# Patient Record
Sex: Male | Born: 1965 | Race: White | Hispanic: No | Marital: Married | Smoking: Never smoker
Health system: Southern US, Community
[De-identification: ages and names within clinical notes are randomized; demographics above are authoritative.]

## PROBLEM LIST (undated history)

## (undated) DIAGNOSIS — R519 Headache, unspecified: Secondary | ICD-10-CM

## (undated) DIAGNOSIS — R7303 Prediabetes: Secondary | ICD-10-CM

## (undated) DIAGNOSIS — R011 Cardiac murmur, unspecified: Secondary | ICD-10-CM

## (undated) DIAGNOSIS — I10 Essential (primary) hypertension: Secondary | ICD-10-CM

## (undated) DIAGNOSIS — C801 Malignant (primary) neoplasm, unspecified: Secondary | ICD-10-CM

## (undated) DIAGNOSIS — K219 Gastro-esophageal reflux disease without esophagitis: Secondary | ICD-10-CM

## (undated) DIAGNOSIS — J189 Pneumonia, unspecified organism: Secondary | ICD-10-CM

## (undated) DIAGNOSIS — M549 Dorsalgia, unspecified: Secondary | ICD-10-CM

## (undated) DIAGNOSIS — M1990 Unspecified osteoarthritis, unspecified site: Secondary | ICD-10-CM

## (undated) DIAGNOSIS — G8929 Other chronic pain: Secondary | ICD-10-CM

## (undated) HISTORY — PX: TONSILLECTOMY: SUR1361

## (undated) HISTORY — PX: COLECTOMY: SHX59

## (undated) HISTORY — PX: OTHER SURGICAL HISTORY: SHX169

---

## 2010-06-21 ENCOUNTER — Emergency Department (HOSPITAL_BASED_OUTPATIENT_CLINIC_OR_DEPARTMENT_OTHER)
Admission: EM | Admit: 2010-06-21 | Discharge: 2010-06-21 | Payer: Self-pay | Source: Home / Self Care | Admitting: Emergency Medicine

## 2012-07-05 ENCOUNTER — Emergency Department (HOSPITAL_BASED_OUTPATIENT_CLINIC_OR_DEPARTMENT_OTHER)
Admission: EM | Admit: 2012-07-05 | Discharge: 2012-07-05 | Disposition: A | Discharge status: Home / Self Care | Payer: Self-pay | Attending: Emergency Medicine | Admitting: Emergency Medicine

## 2012-07-05 ENCOUNTER — Encounter (HOSPITAL_BASED_OUTPATIENT_CLINIC_OR_DEPARTMENT_OTHER): Payer: Self-pay | Admitting: *Deleted

## 2012-07-05 DIAGNOSIS — Y929 Unspecified place or not applicable: Secondary | ICD-10-CM | POA: Insufficient documentation

## 2012-07-05 DIAGNOSIS — Y9389 Activity, other specified: Secondary | ICD-10-CM | POA: Insufficient documentation

## 2012-07-05 DIAGNOSIS — G8929 Other chronic pain: Secondary | ICD-10-CM | POA: Insufficient documentation

## 2012-07-05 DIAGNOSIS — X500XXA Overexertion from strenuous movement or load, initial encounter: Secondary | ICD-10-CM | POA: Insufficient documentation

## 2012-07-05 DIAGNOSIS — R209 Unspecified disturbances of skin sensation: Secondary | ICD-10-CM | POA: Insufficient documentation

## 2012-07-05 DIAGNOSIS — R011 Cardiac murmur, unspecified: Secondary | ICD-10-CM | POA: Insufficient documentation

## 2012-07-05 DIAGNOSIS — M5430 Sciatica, unspecified side: Secondary | ICD-10-CM | POA: Insufficient documentation

## 2012-07-05 HISTORY — DX: Other chronic pain: G89.29

## 2012-07-05 HISTORY — DX: Cardiac murmur, unspecified: R01.1

## 2012-07-05 HISTORY — DX: Dorsalgia, unspecified: M54.9

## 2012-07-05 MED ORDER — IBUPROFEN 600 MG PO TABS: 600.0000 mg | ORAL_TABLET | Freq: Four times a day (QID) | ORAL | Status: DC | PRN

## 2012-07-05 MED ORDER — HYDROCODONE-ACETAMINOPHEN 5-325 MG PO TABS
2.0000 | ORAL_TABLET | Freq: Once | ORAL | Status: AC
  Administered 2012-07-05: 2 via ORAL
  Filled 2012-07-05: qty 2

## 2012-07-05 MED ORDER — DIAZEPAM 2 MG PO TABS
2.0000 mg | ORAL_TABLET | Freq: Once | ORAL | Status: AC
  Administered 2012-07-05: 2 mg via ORAL
  Filled 2012-07-05: qty 1

## 2012-07-05 MED ORDER — IBUPROFEN 200 MG PO TABS
600.0000 mg | ORAL_TABLET | Freq: Once | ORAL | Status: AC
  Administered 2012-07-05: 600 mg via ORAL
  Filled 2012-07-05: qty 1

## 2012-07-05 MED ORDER — METHOCARBAMOL 500 MG PO TABS: 500.0000 mg | ORAL_TABLET | Freq: Four times a day (QID) | ORAL | Status: DC

## 2012-07-05 MED ORDER — HYDROCODONE-ACETAMINOPHEN 5-325 MG PO TABS: 1.0000 | ORAL_TABLET | ORAL | Status: DC | PRN

## 2012-07-05 NOTE — ED Notes (Signed)
Lower back pain since picking up a TV 3 days ago. Pain radiates into his left leg and left groin. Hx of chronic back pain.

## 2012-07-05 NOTE — ED Provider Notes (Signed)
History     CSN: [id number]  Arrival date & time 07/05/12  1312   First MD Initiated Contact with Patient 07/05/12 1331      Chief Complaint  Patient presents with  . Back Pain    (Consider location/radiation/quality/duration/timing/severity/associated sxs/prior treatment) HPI Comments: Pt with no medical hx comes in with cc of back pain, acute. Pt was lifting a 100+ lb TV 3 days agom, and developed sudden pain. The pain is located on the left side of the back, and it radiates down to the left toe. He also has associated numbness, tingling. No associated numbness, weakness, urinary incontinence, urinary retention, bowel incontinence, weakness.   Patient is a 46 y.o. male presenting with back pain. The history is provided by the patient.  Back Pain  Associated symptoms include numbness. Pertinent negatives include no chest pain, no fever, no headaches, no dysuria and no weakness.    Past Medical History  Diagnosis Date  . Chronic back pain   . Heart murmur     Past Surgical History  Procedure Date  . Arm surgery     No family history on file.  History  Substance Use Topics  . Smoking status: Never Smoker   . Smokeless tobacco: Not on file  . Alcohol Use: No      Review of Systems  Constitutional: Negative for fever, chills and activity change.  HENT: Negative for neck pain.   Eyes: Negative for visual disturbance.  Respiratory: Negative for cough, chest tightness and shortness of breath.   Cardiovascular: Negative for chest pain.  Gastrointestinal: Negative for abdominal distention.  Genitourinary: Negative for dysuria, enuresis and difficulty urinating.  Musculoskeletal: Positive for back pain. Negative for arthralgias.  Neurological: Positive for numbness. Negative for dizziness, weakness, light-headedness and headaches.  Psychiatric/Behavioral: Negative for confusion.    Allergies  Review of patient's allergies indicates no known allergies.  Home  Medications   Current Outpatient Rx  Name  Route  Sig  Dispense  Refill  . HYDROCODONE-ACETAMINOPHEN 5-325 MG PO TABS   Oral   Take 1 tablet by mouth every 4 (four) hours as needed for pain.   15 tablet   0   . IBUPROFEN 600 MG PO TABS   Oral   Take 1 tablet (600 mg total) by mouth every 6 (six) hours as needed for pain.   30 tablet   0   . METHOCARBAMOL 500 MG PO TABS   Oral   Take 1 tablet (500 mg total) by mouth 4 (four) times daily.   30 tablet   0     BP 136/77  Pulse 79  Temp 98.2 F (36.8 C) (Oral)  Resp 20  SpO2 100%  Physical Exam  Constitutional: He is oriented to person, place, and time. He appears well-developed.  HENT:  Head: Normocephalic and atraumatic.  Eyes: Conjunctivae normal and EOM are normal. Pupils are equal, round, and reactive to light.  Neck: Normal range of motion. Neck supple.  Cardiovascular: Normal rate and regular rhythm.   Pulmonary/Chest: Effort normal and breath sounds normal.  Abdominal: Soft. Bowel sounds are normal. He exhibits no distension. There is no tenderness. There is no rebound and no guarding.  Musculoskeletal:       Pt has tenderness over the lumbar region and the left lateral lower back region. No step offs, no erythema. Pt has 2+ patellar reflex bilaterally. Able to discriminate between sharp and dull. Able to ambulate + striaght leg test on the left  Neurological: He is alert and oriented to person, place, and time.  Skin: Skin is warm.    ED Course  Procedures (including critical care time)  Labs Reviewed - No data to display No results found.   1. Sciatica       MDM  DDx includes: - DJD of the back - Spondylitises/ spondylosis - Sciatica - Spinal cord compression - Conus medullaris - Epidural hematoma - Epidural abscess - Lytic/pathologic fracture - Myelitis - Musculoskeletal pain  Pt comes in with acute back pain with a precipitating factor. No risk factors for pathologic fractures, lytic  lesions etc. We will clinically diagnose as Sciatica based on the exam and treat wit appropriate meds.  No meds taken at home.   Derwood Kaplan, MD 07/05/12 [phone]

## 2013-01-09 ENCOUNTER — Emergency Department (HOSPITAL_BASED_OUTPATIENT_CLINIC_OR_DEPARTMENT_OTHER)
Admission: EM | Admit: 2013-01-09 | Discharge: 2013-01-09 | Disposition: A | Discharge status: Home / Self Care | Payer: Self-pay | Attending: Emergency Medicine | Admitting: Emergency Medicine

## 2013-01-09 ENCOUNTER — Encounter (HOSPITAL_BASED_OUTPATIENT_CLINIC_OR_DEPARTMENT_OTHER): Payer: Self-pay | Admitting: Family Medicine

## 2013-01-09 ENCOUNTER — Emergency Department (HOSPITAL_BASED_OUTPATIENT_CLINIC_OR_DEPARTMENT_OTHER): Payer: Self-pay

## 2013-01-09 DIAGNOSIS — G8929 Other chronic pain: Secondary | ICD-10-CM | POA: Insufficient documentation

## 2013-01-09 DIAGNOSIS — R011 Cardiac murmur, unspecified: Secondary | ICD-10-CM | POA: Insufficient documentation

## 2013-01-09 DIAGNOSIS — IMO0001 Reserved for inherently not codable concepts without codable children: Secondary | ICD-10-CM | POA: Insufficient documentation

## 2013-01-09 DIAGNOSIS — R1110 Vomiting, unspecified: Secondary | ICD-10-CM | POA: Insufficient documentation

## 2013-01-09 DIAGNOSIS — Z87828 Personal history of other (healed) physical injury and trauma: Secondary | ICD-10-CM | POA: Insufficient documentation

## 2013-01-09 DIAGNOSIS — Z9889 Other specified postprocedural states: Secondary | ICD-10-CM | POA: Insufficient documentation

## 2013-01-09 DIAGNOSIS — M25419 Effusion, unspecified shoulder: Secondary | ICD-10-CM | POA: Insufficient documentation

## 2013-01-09 DIAGNOSIS — M792 Neuralgia and neuritis, unspecified: Secondary | ICD-10-CM

## 2013-01-09 DIAGNOSIS — M5412 Radiculopathy, cervical region: Secondary | ICD-10-CM | POA: Insufficient documentation

## 2013-01-09 DIAGNOSIS — M62838 Other muscle spasm: Secondary | ICD-10-CM | POA: Insufficient documentation

## 2013-01-09 MED ORDER — PREDNISONE 10 MG PO TABS: ORAL_TABLET | ORAL | Status: DC

## 2013-01-09 MED ORDER — HYDROCODONE-ACETAMINOPHEN 5-325 MG PO TABS: 1.0000 | ORAL_TABLET | ORAL | Status: DC | PRN

## 2013-01-09 MED ORDER — ONDANSETRON 4 MG PO TBDP
4.0000 mg | ORAL_TABLET | Freq: Once | ORAL | Status: AC
  Administered 2013-01-09: 4 mg via ORAL

## 2013-01-09 MED ORDER — OXYCODONE-ACETAMINOPHEN 5-325 MG PO TABS
2.0000 | ORAL_TABLET | Freq: Once | ORAL | Status: AC
  Administered 2013-01-09: 2 via ORAL
  Filled 2013-01-09 (×2): qty 2

## 2013-01-09 MED ORDER — ONDANSETRON HCL 8 MG PO TABS: 4.0000 mg | ORAL_TABLET | Freq: Once | ORAL | Status: DC

## 2013-01-09 MED ORDER — SODIUM CHLORIDE 0.9 % IV SOLN: Freq: Once | INTRAVENOUS | Status: DC

## 2013-01-09 MED ORDER — PROMETHAZINE HCL 25 MG/ML IJ SOLN
25.0000 mg | Freq: Once | INTRAMUSCULAR | Status: DC
  Filled 2013-01-09: qty 1

## 2013-01-09 MED ORDER — ONDANSETRON 4 MG PO TBDP
ORAL_TABLET | ORAL | Status: AC
  Administered 2013-01-09: 4 mg via ORAL
  Filled 2013-01-09: qty 1

## 2013-01-09 MED ORDER — PROMETHAZINE HCL 25 MG PO TABS: 25.0000 mg | ORAL_TABLET | Freq: Four times a day (QID) | ORAL | Status: DC | PRN

## 2013-01-09 NOTE — ED Notes (Signed)
Pt c/o right wrist and forearm "spams" x 10 days. Pt reports h/o arm surgery with rod and pins.

## 2013-01-09 NOTE — ED Provider Notes (Signed)
History    CSN: [id number] Arrival date & time 01/09/13  1725  First MD Initiated Contact with Patient 01/09/13 1742     Chief Complaint  Patient presents with  . Arm Problem   (Consider location/radiation/quality/duration/timing/severity/associated sxs/prior Treatment) Patient is a 47 y.o. male presenting with shoulder pain. The history is provided by the patient. No language interpreter was used.  Shoulder Pain This is a new problem. The current episode started 1 to 4 weeks ago. The problem occurs constantly. The problem has been gradually worsening. Associated symptoms include joint swelling, myalgias and vomiting. The symptoms are aggravated by bending. He has tried nothing for the symptoms. The treatment provided no relief.  Pt complains of cramps and muscle spasm in right arm and shoulder.   Pt reports he has plates in his wrist from a previous injury.   Pt reports pain in shoulder, worse with turning head Past Medical History  Diagnosis Date  . Chronic back pain   . Heart murmur    Past Surgical History  Procedure Laterality Date  . Arm surgery     No family history on file. History  Substance Use Topics  . Smoking status: Never Smoker   . Smokeless tobacco: Not on file  . Alcohol Use: No    Review of Systems  Gastrointestinal: Positive for vomiting.  Musculoskeletal: Positive for myalgias and joint swelling.  All other systems reviewed and are negative.    Allergies  Review of patient's allergies indicates no known allergies.  Home Medications   Current Outpatient Rx  Name  Route  Sig  Dispense  Refill  . HYDROcodone-acetaminophen (NORCO/VICODIN) 5-325 MG per tablet   Oral   Take 1 tablet by mouth every 4 (four) hours as needed for pain.   15 tablet   0   . ibuprofen (ADVIL,MOTRIN) 600 MG tablet   Oral   Take 1 tablet (600 mg total) by mouth every 6 (six) hours as needed for pain.   30 tablet   0   . methocarbamol (ROBAXIN) 500 MG tablet   Oral  Take 1 tablet (500 mg total) by mouth 4 (four) times daily.   30 tablet   0    BP 129/69  Pulse 85  Temp(Src) 98.3 F (36.8 C) (Oral)  Resp 20  Ht 5\' 10"  (1.778 m)  Wt 200 lb (90.719 kg)  BMI 28.7 kg/m2  SpO2 98% Physical Exam  Nursing note and vitals reviewed. Constitutional: He appears well-developed and well-nourished.  HENT:  Head: Normocephalic.  Eyes: Conjunctivae are normal. Pupils are equal, round, and reactive to light.  Cardiovascular: Normal rate.   Pulmonary/Chest: Effort normal.  Musculoskeletal: He exhibits tenderness.  Tender cspine,  Tender shoulder,    Neurological: He is alert.  Skin: Skin is warm.  Psychiatric: He has a normal mood and affect.    ED Course  Procedures (including critical care time) Labs Reviewed - No data to display Dg Cervical Spine Complete  01/09/2013   *RADIOLOGY REPORT*  Clinical Data: Severe neck pain  CERVICAL SPINE - COMPLETE 4+ VIEW  Comparison: None.  Findings: Mild cervical kyphosis.  Normal alignment and no fracture or mass.  Mild spondylosis C3-C7.  Foraminal narrowing is present at C5-6 and C6-7.  Right foraminal encroachment at C6-7 is noted.  IMPRESSION: Cervical spondylosis.  No acute bony abnormality.   Original Report Authenticated By: Janeece Riggers, M.D.   Dg Shoulder Right  01/09/2013   *RADIOLOGY REPORT*  Clinical Data: Right shoulder pain.  No recent injury.  RIGHT SHOULDER - 2+ VIEW  Comparison: none  Findings: Normal alignment and no fracture.  No significant degenerative change.  IMPRESSION: Negative   Original Report Authenticated By: Janeece Riggers, M.D.   1. Radicular pain in right arm     MDM  Pt given 2 percocet for pain.  Pt had initial relief but began vomitting.   Pt given IV zofran with some relief.  Pt advised that Cspine film shows narrowing.    Pt had 2nd episode of vomiting.  Pt declined IV and phenergan    Pt given rx for prednsione taper,  Hydrocodone and phenergan.    Pt advised to see Dr. Phineas Douglas for  recheck in 1 week.    Elson Areas, PA-C 01/09/13 2101  Lonia Skinner [place], PA-C 01/09/13 2104

## 2013-01-09 NOTE — ED Notes (Signed)
Pt originally refused iv fluids and phenegran, felling much better at this time

## 2013-01-10 NOTE — ED Provider Notes (Signed)
Medical screening examination/treatment/procedure(s) were performed by non-physician practitioner and as supervising physician I was immediately available for consultation/collaboration.   Loren Racer, MD 01/10/13 [phone]

## 2013-06-16 ENCOUNTER — Emergency Department (HOSPITAL_BASED_OUTPATIENT_CLINIC_OR_DEPARTMENT_OTHER)
Admission: EM | Admit: 2013-06-16 | Discharge: 2013-06-16 | Disposition: A | Discharge status: Home / Self Care | Payer: Self-pay | Attending: Emergency Medicine | Admitting: Emergency Medicine

## 2013-06-16 ENCOUNTER — Emergency Department (HOSPITAL_BASED_OUTPATIENT_CLINIC_OR_DEPARTMENT_OTHER): Payer: Self-pay

## 2013-06-16 ENCOUNTER — Encounter (HOSPITAL_BASED_OUTPATIENT_CLINIC_OR_DEPARTMENT_OTHER): Payer: Self-pay | Admitting: Emergency Medicine

## 2013-06-16 DIAGNOSIS — M25539 Pain in unspecified wrist: Secondary | ICD-10-CM | POA: Insufficient documentation

## 2013-06-16 DIAGNOSIS — M25532 Pain in left wrist: Secondary | ICD-10-CM

## 2013-06-16 DIAGNOSIS — G8929 Other chronic pain: Secondary | ICD-10-CM | POA: Insufficient documentation

## 2013-06-16 DIAGNOSIS — R011 Cardiac murmur, unspecified: Secondary | ICD-10-CM | POA: Insufficient documentation

## 2013-06-16 DIAGNOSIS — Z79899 Other long term (current) drug therapy: Secondary | ICD-10-CM | POA: Insufficient documentation

## 2013-06-16 MED ORDER — OXYCODONE-ACETAMINOPHEN 5-325 MG PO TABS: 1.0000 | ORAL_TABLET | Freq: Once | ORAL | Status: DC

## 2013-06-16 MED ORDER — OXYCODONE-ACETAMINOPHEN 5-325 MG PO TABS
1.0000 | ORAL_TABLET | Freq: Once | ORAL | Status: AC
  Administered 2013-06-16: 1 via ORAL
  Filled 2013-06-16: qty 1

## 2013-06-16 NOTE — ED Provider Notes (Signed)
CSN: [id number]     Arrival date & time 06/16/13  2031 History   First MD Initiated Contact with Patient 06/16/13 2200     Chief Complaint  Patient presents with  . Hand Pain   (Consider location/radiation/quality/duration/timing/severity/associated sxs/prior Treatment) Patient is a 47 y.o. male presenting with hand pain. The history is provided by the patient and the spouse. No language interpreter was used.  Hand Pain This is a new problem. Pertinent negatives include no fever. Associated symptoms comments: Pain at left wrist for the past 5 days without known injury. He has had mild swelling. No numbness or tingling. No discoloration. He reports feeling a 'knot' on the wrist that is the source of the pain..    Past Medical History  Diagnosis Date  . Chronic back pain   . Heart murmur    Past Surgical History  Procedure Laterality Date  . Arm surgery     History reviewed. No pertinent family history. History  Substance Use Topics  . Smoking status: Never Smoker   . Smokeless tobacco: Not on file  . Alcohol Use: No    Review of Systems  Constitutional: Negative for fever.  Musculoskeletal:       See HPI.  Skin: Negative for color change.    Allergies  Review of patient's allergies indicates no known allergies.  Home Medications   Current Outpatient Rx  Name  Route  Sig  Dispense  Refill  . HYDROcodone-acetaminophen (NORCO/VICODIN) 5-325 MG per tablet   Oral   Take 1 tablet by mouth every 4 (four) hours as needed for pain.   20 tablet   0   . ibuprofen (ADVIL,MOTRIN) 600 MG tablet   Oral   Take 1 tablet (600 mg total) by mouth every 6 (six) hours as needed for pain.   30 tablet   0   . methocarbamol (ROBAXIN) 500 MG tablet   Oral   Take 1 tablet (500 mg total) by mouth 4 (four) times daily.   30 tablet   0   . predniSONE (DELTASONE) 10 MG tablet      6,5,4,3,2,1 taper   21 tablet   0   . promethazine (PHENERGAN) 25 MG tablet   Oral   Take 1 tablet  (25 mg total) by mouth every 6 (six) hours as needed for nausea.   14 tablet   0    BP 134/77  Pulse 88  Temp(Src) 98.9 F (37.2 C) (Oral)  Resp 16  Ht 5\' 11"  (1.803 m)  Wt 207 lb (93.895 kg)  BMI 28.88 kg/m2  SpO2 96% Physical Exam  Constitutional: He is oriented to person, place, and time. He appears well-developed and well-nourished. No distress.  Musculoskeletal:  Left wrist/hand mildly swollen without redness or warmth. Focal tenderness to dorsum wrist at palpable nodule that is firm non-mobile. FROM of wrist and fingers.   Neurological: He is alert and oriented to person, place, and time.  Skin: Skin is warm and dry.    ED Course  Procedures (including critical care time) Labs Review Labs Reviewed - No data to display Imaging Review Dg Hand Complete Left  06/16/2013   CLINICAL DATA:  Pain for 5 days without trauma  EXAM: LEFT HAND - COMPLETE 3+ VIEW  COMPARISON:  None.  FINDINGS: No acute fracture or dislocation. Mild osteoarthritis, including at the distal interphalangeal joints of the 3rd and 4th digits.  IMPRESSION: No acute osseous abnormality.   Electronically Signed   By: Jeronimo Greaves  M.D.   On: 06/16/2013 21:37    EKG Interpretation   None       MDM  No diagnosis found. 1. Left wrist pain  Negative x-ray for bony injury. No vascular compromise seen on exam. Suspect musculoskeletal sprain/strain injury. REfer back to [facility] where he has been a patient.    Arnoldo Hooker, PA-C 06/16/13 2322

## 2013-06-16 NOTE — ED Notes (Signed)
Left hand pain onset 5 days ago denies event or injury

## 2013-06-17 ENCOUNTER — Telehealth (HOSPITAL_BASED_OUTPATIENT_CLINIC_OR_DEPARTMENT_OTHER): Payer: Self-pay

## 2013-06-17 NOTE — ED Provider Notes (Signed)
Medical screening examination/treatment/procedure(s) were performed by non-physician practitioner and as supervising physician I was immediately available for consultation/collaboration.  EKG Interpretation   None         William Jonquil Stubbe, MD 06/17/13 0007 

## 2013-09-02 ENCOUNTER — Emergency Department (HOSPITAL_BASED_OUTPATIENT_CLINIC_OR_DEPARTMENT_OTHER)
Admission: EM | Admit: 2013-09-02 | Discharge: 2013-09-02 | Disposition: A | Discharge status: Home / Self Care | Payer: Self-pay | Attending: Emergency Medicine | Admitting: Emergency Medicine

## 2013-09-02 ENCOUNTER — Emergency Department (HOSPITAL_BASED_OUTPATIENT_CLINIC_OR_DEPARTMENT_OTHER): Payer: Self-pay

## 2013-09-02 ENCOUNTER — Encounter (HOSPITAL_BASED_OUTPATIENT_CLINIC_OR_DEPARTMENT_OTHER): Payer: Self-pay | Admitting: Emergency Medicine

## 2013-09-02 DIAGNOSIS — J209 Acute bronchitis, unspecified: Secondary | ICD-10-CM | POA: Insufficient documentation

## 2013-09-02 DIAGNOSIS — R011 Cardiac murmur, unspecified: Secondary | ICD-10-CM | POA: Insufficient documentation

## 2013-09-02 DIAGNOSIS — J40 Bronchitis, not specified as acute or chronic: Secondary | ICD-10-CM

## 2013-09-02 DIAGNOSIS — Z79899 Other long term (current) drug therapy: Secondary | ICD-10-CM | POA: Insufficient documentation

## 2013-09-02 DIAGNOSIS — G8929 Other chronic pain: Secondary | ICD-10-CM | POA: Insufficient documentation

## 2013-09-02 DIAGNOSIS — IMO0002 Reserved for concepts with insufficient information to code with codable children: Secondary | ICD-10-CM | POA: Insufficient documentation

## 2013-09-02 MED ORDER — AZITHROMYCIN 250 MG PO TABS: 250.0000 mg | ORAL_TABLET | Freq: Every day | ORAL | Status: DC

## 2013-09-02 MED ORDER — HYDROCODONE-ACETAMINOPHEN 5-325 MG PO TABS: 2.0000 | ORAL_TABLET | ORAL | Status: DC | PRN

## 2013-09-02 NOTE — ED Provider Notes (Signed)
CSN: [id number]     Arrival date & time 09/02/13  1456 History  This chart was scribed for Don Johns, MD by Maree Erie, ED Scribe. The patient was seen in room MH01/MH01. Patient's care was started at 4:20 PM.     Chief Complaint  Patient presents with  . Cough     Patient is a 48 y.o. male presenting with cough. The history is provided by the patient. No language interpreter was used.  Cough Associated symptoms: chest pain (with cough)   Associated symptoms: no chills, no diaphoresis, no fever, no headaches, no rash, no rhinorrhea and no shortness of breath     HPI Comments: Don Rios is a 48 y.o. male who presents to the Emergency Department complaining of a constant productive cough with green sputum that began about a week ago. He has associated right sided chest pain with coughing. He describes the pain as "getting hit with a baseball bat." He has been taking Dayquil, Nyquil, Robitussin and Alka Seltzer without relief. He denies shortness of breath, fever, vomiting, rhinorrhea or congestion.  Pt denies having a PCP currently.  Past Medical History  Diagnosis Date  . Chronic back pain   . Heart murmur    Past Surgical History  Procedure Laterality Date  . Arm surgery     No family history on file. History  Substance Use Topics  . Smoking status: Never Smoker   . Smokeless tobacco: Not on file  . Alcohol Use: No    Review of Systems  Constitutional: Negative for fever, chills, diaphoresis and fatigue.  HENT: Negative for congestion, rhinorrhea and sneezing.   Eyes: Negative.   Respiratory: Positive for cough. Negative for chest tightness and shortness of breath.   Cardiovascular: Positive for chest pain (with cough). Negative for leg swelling.  Gastrointestinal: Negative for nausea, vomiting, abdominal pain, diarrhea and blood in stool.  Genitourinary: Negative for frequency, hematuria, flank pain and difficulty urinating.  Musculoskeletal: Negative for  arthralgias and back pain.  Skin: Negative for rash.  Neurological: Negative for dizziness, speech difficulty, weakness, numbness and headaches.      Allergies  Review of patient's allergies indicates no known allergies.  Home Medications   Current Outpatient Rx  Name  Route  Sig  Dispense  Refill  . azithromycin (ZITHROMAX) 250 MG tablet   Oral   Take 1 tablet (250 mg total) by mouth daily. Take first 2 tablets together, then 1 every day until finished.   6 tablet   0   . HYDROcodone-acetaminophen (NORCO/VICODIN) 5-325 MG per tablet   Oral   Take 1 tablet by mouth every 4 (four) hours as needed for pain.   20 tablet   0   . HYDROcodone-acetaminophen (NORCO/VICODIN) 5-325 MG per tablet   Oral   Take 2 tablets by mouth every 4 (four) hours as needed.   15 tablet   0   . ibuprofen (ADVIL,MOTRIN) 600 MG tablet   Oral   Take 1 tablet (600 mg total) by mouth every 6 (six) hours as needed for pain.   30 tablet   0   . methocarbamol (ROBAXIN) 500 MG tablet   Oral   Take 1 tablet (500 mg total) by mouth 4 (four) times daily.   30 tablet   0   . oxyCODONE-acetaminophen (PERCOCET/ROXICET) 5-325 MG per tablet   Oral   Take 1-2 tablets by mouth once.   20 tablet   0   . predniSONE (DELTASONE) 10 MG tablet  6,5,4,3,2,1 taper   21 tablet   0   . promethazine (PHENERGAN) 25 MG tablet   Oral   Take 1 tablet (25 mg total) by mouth every 6 (six) hours as needed for nausea.   14 tablet   0    Triage Vitals: BP 117/79  Pulse 74  Temp(Src) 98.2 F (36.8 C) (Oral)  Resp 20  Ht 5\' 11"  (1.803 m)  Wt 207 lb (93.895 kg)  BMI 28.88 kg/m2  SpO2 100%  Physical Exam  Nursing note and vitals reviewed. Constitutional: He is oriented to person, place, and time. He appears well-developed and well-nourished.  HENT:  Head: Normocephalic and atraumatic.  Eyes: Pupils are equal, round, and reactive to light.  Neck: Normal range of motion. Neck supple.  Cardiovascular:  Normal rate, regular rhythm and normal heart sounds.   Pulmonary/Chest: Effort normal and breath sounds normal. No respiratory distress. He has no wheezes. He has no rales. He exhibits tenderness.  Tenderness to palpation of right mid ribs at the mid axillary line.   Abdominal: Soft. Bowel sounds are normal. There is no tenderness. There is no rebound and no guarding.  Musculoskeletal: Normal range of motion. He exhibits no edema.  Lymphadenopathy:    He has no cervical adenopathy.  Neurological: He is alert and oriented to person, place, and time.  Skin: Skin is warm and dry. No rash noted.  Psychiatric: He has a normal mood and affect.    ED Course  Procedures (including critical care time)  DIAGNOSTIC STUDIES: Oxygen Saturation is 100% on room air, normal by my interpretation.    COORDINATION OF CARE: 4:23 PM -Will repeat chest x-ray with nipple markers because patient does not have established PCP. Patient verbalizes understanding and agrees with treatment plan.    Labs Review Labs Reviewed - No data to display Imaging Review Dg Chest 2 View  09/02/2013   CLINICAL DATA:  Possible lung nodule  EXAM: CHEST  2 VIEW  COMPARISON:  09/02/2013, 11/17/2010  FINDINGS: Cardiac shadow is stable. The lungs are clear bilaterally. The previously seen density suggesting a nipple shadow is shown did not represent a nipple shadow with nipple markers placed. Comparison however with the prior CT examination shows that this represents a calcified granuloma at.  IMPRESSION: Findings of prior granulomatous disease. No other focal abnormality is seen.   Electronically Signed   By: Inez Catalina M.D.   On: 09/02/2013 16:50   Dg Chest 2 View  09/02/2013   CLINICAL DATA:  Cough  EXAM: CHEST  2 VIEW  COMPARISON:  Nov 17, 2010  FINDINGS: There is a nodular opacity in or overlying the left base which potentially may represent a nipple shadow. Elsewhere, lungs are clear. Heart size and pulmonary vascularity are  normal. No adenopathy. No bone lesions.  IMPRESSION: Question nipple shadow versus small nodule left base. Advise repeat study with nipple markers to further evaluate. Lungs elsewhere clear.   Electronically Signed   By: Lowella Grip M.D.   On: 09/02/2013 15:52    EKG Interpretation   None       MDM   Final diagnoses:  Bronchitis    Patient presents with a one-week history of worsening cough and chest congestion. He has no shortness of breath. He has some tenderness in his right ribs which he says occurred with a bad coughing spell. It's worse with movement and worse with palpation. He did not have any other symptoms suggestive of pulmonary embolus. There is no  evidence of pneumonia or pneumothorax. He was discharged with a prescription for Zithromax and Vicodin for pain and coughing. He was encouraged to followup with his primary care physician if his symptoms are not improving.  I personally performed the services described in this documentation, which was scribed in my presence.  The recorded information has been reviewed and considered.     Don Johns, MD 09/02/13 [phone]

## 2013-09-02 NOTE — Discharge Instructions (Signed)

## 2013-09-02 NOTE — ED Notes (Signed)
Productive cough with green sputum x 1 week.

## 2013-09-19 ENCOUNTER — Emergency Department (HOSPITAL_BASED_OUTPATIENT_CLINIC_OR_DEPARTMENT_OTHER)
Admission: EM | Admit: 2013-09-19 | Discharge: 2013-09-19 | Disposition: A | Discharge status: Home / Self Care | Payer: Self-pay | Attending: Emergency Medicine | Admitting: Emergency Medicine

## 2013-09-19 ENCOUNTER — Encounter (HOSPITAL_BASED_OUTPATIENT_CLINIC_OR_DEPARTMENT_OTHER): Payer: Self-pay | Admitting: Emergency Medicine

## 2013-09-19 DIAGNOSIS — G8929 Other chronic pain: Secondary | ICD-10-CM | POA: Insufficient documentation

## 2013-09-19 DIAGNOSIS — IMO0002 Reserved for concepts with insufficient information to code with codable children: Secondary | ICD-10-CM | POA: Insufficient documentation

## 2013-09-19 DIAGNOSIS — X500XXA Overexertion from strenuous movement or load, initial encounter: Secondary | ICD-10-CM | POA: Insufficient documentation

## 2013-09-19 DIAGNOSIS — Y929 Unspecified place or not applicable: Secondary | ICD-10-CM | POA: Insufficient documentation

## 2013-09-19 DIAGNOSIS — Z79899 Other long term (current) drug therapy: Secondary | ICD-10-CM | POA: Insufficient documentation

## 2013-09-19 DIAGNOSIS — R011 Cardiac murmur, unspecified: Secondary | ICD-10-CM | POA: Insufficient documentation

## 2013-09-19 DIAGNOSIS — M5380 Other specified dorsopathies, site unspecified: Secondary | ICD-10-CM | POA: Insufficient documentation

## 2013-09-19 DIAGNOSIS — M62838 Other muscle spasm: Secondary | ICD-10-CM

## 2013-09-19 DIAGNOSIS — Y9389 Activity, other specified: Secondary | ICD-10-CM | POA: Insufficient documentation

## 2013-09-19 MED ORDER — METHOCARBAMOL 500 MG PO TABS: 500.0000 mg | ORAL_TABLET | Freq: Two times a day (BID) | ORAL | Status: DC

## 2013-09-19 MED ORDER — PREDNISONE 20 MG PO TABS: ORAL_TABLET | ORAL | Status: DC

## 2013-09-19 MED ORDER — PREDNISONE 50 MG PO TABS
60.0000 mg | ORAL_TABLET | Freq: Once | ORAL | Status: AC
  Administered 2013-09-19: 60 mg via ORAL
  Filled 2013-09-19 (×2): qty 1

## 2013-09-19 MED ORDER — HYDROCODONE-ACETAMINOPHEN 5-325 MG PO TABS
2.0000 | ORAL_TABLET | Freq: Once | ORAL | Status: AC
  Administered 2013-09-19: 2 via ORAL
  Filled 2013-09-19: qty 2

## 2013-09-19 MED ORDER — LIDOCAINE 5 % EX PTCH: 1.0000 | MEDICATED_PATCH | CUTANEOUS | Status: DC

## 2013-09-19 MED ORDER — METHOCARBAMOL 500 MG PO TABS
1000.0000 mg | ORAL_TABLET | Freq: Once | ORAL | Status: AC
  Administered 2013-09-19: 1000 mg via ORAL
  Filled 2013-09-19: qty 2

## 2013-09-19 MED ORDER — HYDROCODONE-ACETAMINOPHEN 5-325 MG PO TABS: 1.0000 | ORAL_TABLET | Freq: Four times a day (QID) | ORAL | Status: DC | PRN

## 2013-09-19 NOTE — Discharge Instructions (Signed)
Muscle Cramps and Spasms Muscle cramps and spasms occur when a muscle or muscles tighten and you have no control over this tightening (involuntary muscle contraction). They are a common problem and can develop in any muscle. The most common place is in the calf muscles of the leg. Both muscle cramps and muscle spasms are involuntary muscle contractions, but they also have differences:   Muscle cramps are sporadic and painful. They may last a few seconds to a quarter of an hour. Muscle cramps are often more forceful and last longer than muscle spasms.  Muscle spasms may or may not be painful. They may also last just a few seconds or much longer. CAUSES  It is uncommon for cramps or spasms to be due to a serious underlying problem. In many cases, the cause of cramps or spasms is unknown. Some common causes are:   Overexertion.   Overuse from repetitive motions (doing the same thing over and over).   Remaining in a certain position for a long period of time.   Improper preparation, form, or technique while performing a sport or activity.   Dehydration.   Injury.   Side effects of some medicines.   Abnormally low levels of the salts and ions in your blood (electrolytes), especially potassium and calcium. This could happen if you are taking water pills (diuretics) or you are pregnant.  Some underlying medical problems can make it more likely to develop cramps or spasms. These include, but are not limited to:   Diabetes.   Parkinson disease.   Hormone disorders, such as thyroid problems.   Alcohol abuse.   Diseases specific to muscles, joints, and bones.   Blood vessel disease where not enough blood is getting to the muscles.  HOME CARE INSTRUCTIONS   Stay well hydrated. Drink enough water and fluids to keep your urine clear or pale yellow.  It may be helpful to massage, stretch, and relax the affected muscle.  For tight or tense muscles, use a warm towel, heating  pad, or hot shower water directed to the affected area.  If you are sore or have pain after a cramp or spasm, applying ice to the affected area may relieve discomfort.  Put ice in a plastic bag.  Place a towel between your skin and the bag.  Leave the ice on for 15-20 minutes, 03-04 times a day.  Medicines used to treat a known cause of cramps or spasms may help reduce their frequency or severity. Only take over-the-counter or prescription medicines as directed by your caregiver. SEEK MEDICAL CARE IF:  Your cramps or spasms get more severe, more frequent, or do not improve over time.  MAKE SURE YOU:   Understand these instructions.  Will watch your condition.  Will get help right away if you are not doing well or get worse. Document Released: 12/24/2001 Document Revised: 10/29/2012 Document Reviewed: 06/20/2012 [facility] Patient Information 2014 [place], [place].  Heat Therapy Heat therapy can help ease achy, tense, stiff, and tight muscles and joints. Heat should not be used on new injuries. Wait at least 48 hours after the injury before using heat therapy. Heat also should not be used for discomfort or pain that occurs right after doing an activity. If you still have pain or stiffness 3 hours after finishing the activity, then heat therapy may be used. PRECAUTIONS  High heat or prolonged exposure to heat can cause burns. Be careful when using heat therapy to avoid burning your skin. If you have any  of the following conditions, do not use heat until you have discussed heat therapy with your caregiver:  Poor circulation.  Healing wounds or scarred skin in the area being treated.  Diabetes, heart disease, or high blood pressure.  Numbness of the area being treated.  Unusual swelling of the area being treated.  Active infections.  Blood clots.  Cancer.  Inability to communicate your response to pain. This can include young children and people with dementia. HOME CARE  INSTRUCTIONS Moist heat pack  Soak a clean towel in warm water, and squeeze out the extra water. The water temperature should be comfortable to the skin.  Put the warm, wet towel in a plastic bag.  Place a thin, dry towel between your skin and the bag.  Put the heat pack on the area for 5 minutes, and check your skin. Your skin may be pink, but it should not be red.  Leave the heat pack on the area for a total of 15 to 30 minutes.  Repeat this every 2 to 4 hours while awake. Do not use heat while you are sleeping. Warm water bath  Fill a tub with warm water. The water temperature should be comfortable to the skin.  Place the affected body part in the tub.  Soak the area for 20 to 40 minutes.  Repeat as needed. Hot water bottle  Fill the water bottle half full with hot water.  Press out the extra air. Close the cap tightly.  Place a dry towel between your skin and the bottle.  Put the bottle on the area for 5 minutes, and check your skin. Your skin may be pink, but it should not be red.  Leave the bottle on the area for a total of 15 to 30 minutes.  Repeat this every 2 to 4 hours while awake. Electric heating pad  Place a dry towel between your skin and the heating pad.  Set the heating pad on low heat.  Put the heating pad on the area for 10 minutes, and check your skin. Your skin may be pink, but it should not be red.  Leave the heating pad on the area for a total of 20 to 40 minutes.  Repeat this every 2 to 4 hours while awake.  Do not lie on the heating pad.  Do not fall asleep while using the heating pad.  Do not use the heating pad near water. Contact with water can result in an electrical shock. SEEK MEDICAL CARE IF:  You have blisters, redness, swelling, or numbness.  You have any new problems.  Your problems are getting worse.  You have any questions or concerns. If you develop any problems, stop using heat therapy until you see your  caregiver. MAKE SURE YOU:  Understand these instructions.  Will watch your condition.  Will get help right away if you are not doing well or get worse. Document Released: 09/26/2011 Document Reviewed: 09/26/2011 [facility] Patient Information 2014 [place].

## 2013-09-19 NOTE — ED Provider Notes (Signed)
CSN: [id number]     Arrival date & time 09/19/13  7616 History   None    Chief Complaint  Patient presents with  . Back Injury     (Consider location/radiation/quality/duration/timing/severity/associated sxs/prior Treatment) Patient is a 48 y.o. male presenting with back pain. The history is provided by the patient. No language interpreter was used.  Back Pain Location:  Sacro-iliac joint Quality:  Aching Radiates to:  Does not radiate Pain severity:  Severe Pain is:  Same all the time Onset quality:  Sudden Timing:  Constant Progression:  Unchanged Chronicity:  Recurrent Context: lifting heavy objects   Relieved by:  Nothing Worsened by:  Nothing tried Ineffective treatments:  NSAIDs Associated symptoms: no abdominal pain, no abdominal swelling, no bladder incontinence, no bowel incontinence, no chest pain, no fever, no headaches, no leg pain, no numbness, no paresthesias, no pelvic pain, no perianal numbness, no tingling, no weakness and no weight loss   Risk factors: no hx of cancer     Past Medical History  Diagnosis Date  . Chronic back pain   . Heart murmur    Past Surgical History  Procedure Laterality Date  . Arm surgery     History reviewed. No pertinent family history. History  Substance Use Topics  . Smoking status: Never Smoker   . Smokeless tobacco: Not on file  . Alcohol Use: No    Review of Systems  Constitutional: Negative for fever and weight loss.  Cardiovascular: Negative for chest pain.  Gastrointestinal: Negative for abdominal pain and bowel incontinence.  Genitourinary: Negative for bladder incontinence and pelvic pain.  Musculoskeletal: Positive for back pain.  Neurological: Negative for tingling, weakness, numbness, headaches and paresthesias.  All other systems reviewed and are negative.      Allergies  Review of patient's allergies indicates no known allergies.  Home Medications   Current Outpatient Rx  Name  Route  Sig   Dispense  Refill  . azithromycin (ZITHROMAX) 250 MG tablet   Oral   Take 1 tablet (250 mg total) by mouth daily. Take first 2 tablets together, then 1 every day until finished.   6 tablet   0   . HYDROcodone-acetaminophen (NORCO) 5-325 MG per tablet   Oral   Take 1 tablet by mouth every 6 (six) hours as needed.   10 tablet   0   . HYDROcodone-acetaminophen (NORCO/VICODIN) 5-325 MG per tablet   Oral   Take 1 tablet by mouth every 4 (four) hours as needed for pain.   20 tablet   0   . HYDROcodone-acetaminophen (NORCO/VICODIN) 5-325 MG per tablet   Oral   Take 2 tablets by mouth every 4 (four) hours as needed.   15 tablet   0   . ibuprofen (ADVIL,MOTRIN) 600 MG tablet   Oral   Take 1 tablet (600 mg total) by mouth every 6 (six) hours as needed for pain.   30 tablet   0   . lidocaine (LIDODERM) 5 %   Transdermal   Place 1 patch onto the skin daily. Remove & Discard patch within 12 hours or as directed by MD   30 patch   0   . methocarbamol (ROBAXIN) 500 MG tablet   Oral   Take 1 tablet (500 mg total) by mouth 4 (four) times daily.   30 tablet   0   . methocarbamol (ROBAXIN) 500 MG tablet   Oral   Take 1 tablet (500 mg total) by mouth 2 (two) times  daily.   20 tablet   0   . oxyCODONE-acetaminophen (PERCOCET/ROXICET) 5-325 MG per tablet   Oral   Take 1-2 tablets by mouth once.   20 tablet   0   . predniSONE (DELTASONE) 10 MG tablet      6,5,4,3,2,1 taper   21 tablet   0   . predniSONE (DELTASONE) 20 MG tablet      3 tabs po day one, then 2 po daily x 4 days   11 tablet   0   . promethazine (PHENERGAN) 25 MG tablet   Oral   Take 1 tablet (25 mg total) by mouth every 6 (six) hours as needed for nausea.   14 tablet   0    BP 123/56  Pulse 88  Temp(Src) 97.8 F (36.6 C) (Oral)  Resp 20  Ht 5\' 11"  (1.803 m)  Wt 205 lb (92.987 kg)  BMI 28.60 kg/m2  SpO2 96% Physical Exam  Constitutional: He is oriented to person, place, and time. He appears  well-developed and well-nourished. No distress.  HENT:  Head: Normocephalic and atraumatic.  Mouth/Throat: Oropharynx is clear and moist.  Eyes: Conjunctivae are normal. Pupils are equal, round, and reactive to light.  Neck: Normal range of motion. Neck supple.  Cardiovascular: Normal rate, regular rhythm and intact distal pulses.   Pulmonary/Chest: Effort normal and breath sounds normal. He has no wheezes. He has no rales.  Abdominal: Soft. Bowel sounds are increased. There is no tenderness. There is no rebound and no guarding.  Musculoskeletal: Normal range of motion. He exhibits no edema and no tenderness.       Lumbar back: He exhibits normal range of motion, no bony tenderness, no swelling and no edema.       Back:  Neurological: He is alert and oriented to person, place, and time. He has normal reflexes.  Skin: Skin is warm and dry.  Psychiatric: He has a normal mood and affect.    ED Course  Procedures (including critical care time) Labs Review Labs Reviewed - No data to display Imaging Review No results found.   EKG Interpretation None      MDM   Final diagnoses:  None   Muscle spasm.  Doubt bony pathology based on exam.  Will treat for muscle spasm.  Follow up at your family doctor for ongoing care     Ricquel Foulk K Laquana Villari-Rasch, MD 09/19/13 [phone]

## 2013-09-19 NOTE — ED Notes (Signed)
Low back pain after loading wood yesterday, no relief from Aleve

## 2014-07-23 ENCOUNTER — Encounter (HOSPITAL_BASED_OUTPATIENT_CLINIC_OR_DEPARTMENT_OTHER): Payer: Self-pay

## 2014-07-23 ENCOUNTER — Emergency Department (HOSPITAL_BASED_OUTPATIENT_CLINIC_OR_DEPARTMENT_OTHER)
Admission: EM | Admit: 2014-07-23 | Discharge: 2014-07-23 | Disposition: A | Discharge status: Home / Self Care | Payer: Self-pay | Attending: Emergency Medicine | Admitting: Emergency Medicine

## 2014-07-23 DIAGNOSIS — R011 Cardiac murmur, unspecified: Secondary | ICD-10-CM | POA: Insufficient documentation

## 2014-07-23 DIAGNOSIS — G8929 Other chronic pain: Secondary | ICD-10-CM | POA: Insufficient documentation

## 2014-07-23 DIAGNOSIS — L0211 Cutaneous abscess of neck: Secondary | ICD-10-CM | POA: Insufficient documentation

## 2014-07-23 MED ORDER — CEPHALEXIN 500 MG PO CAPS: 500.0000 mg | ORAL_CAPSULE | Freq: Four times a day (QID) | ORAL | Status: DC

## 2014-07-23 MED ORDER — SULFAMETHOXAZOLE-TRIMETHOPRIM 800-160 MG PO TABS: 1.0000 | ORAL_TABLET | Freq: Two times a day (BID) | ORAL | Status: AC

## 2014-07-23 MED ORDER — HYDROCODONE-ACETAMINOPHEN 5-325 MG PO TABS: 1.0000 | ORAL_TABLET | Freq: Four times a day (QID) | ORAL | Status: DC | PRN

## 2014-07-23 NOTE — ED Notes (Signed)
C/o abscess to left posterior neck x 1 week-?spider bite

## 2014-07-23 NOTE — ED Provider Notes (Addendum)
CSN: [id number]     Arrival date & time 07/23/14  1135 History   First MD Initiated Contact with Patient 07/23/14 1142     Chief Complaint  Patient presents with  . Abscess     (Consider location/radiation/quality/duration/timing/severity/associated sxs/prior Treatment) HPI Comments: Patient is a 49 year old male presents with complaints of a swollen, tender area to the back of his left neck. He is concerned he may have been bitten by a spider. He denies any fevers or chills. The wife has been treating it with various topical applications at home, however continues to worsen.  Patient is a 49 y.o. male presenting with abscess. The history is provided by the patient.  Abscess Location:  Head/neck Abscess quality: induration and warmth   Abscess quality: not draining and no fluctuance   Duration:  1 week Progression:  Worsening Chronicity:  New Relieved by:  Nothing Worsened by:  Nothing tried Ineffective treatments:  None tried   Past Medical History  Diagnosis Date  . Chronic back pain   . Heart murmur    Past Surgical History  Procedure Laterality Date  . Arm surgery     No family history on file. History  Substance Use Topics  . Smoking status: Never Smoker   . Smokeless tobacco: Not on file  . Alcohol Use: No    Review of Systems  All other systems reviewed and are negative.     Allergies  Review of patient's allergies indicates no known allergies.  Home Medications   Prior to Admission medications   Not on File   BP 125/71 mmHg  Pulse 82  Temp(Src) 98.4 F (36.9 C) (Oral)  Resp 18  Ht 5\' 11"  (1.803 m)  Wt 205 lb (92.987 kg)  BMI 28.60 kg/m2  SpO2 98% Physical Exam  Constitutional: He is oriented to person, place, and time. He appears well-developed and well-nourished. No distress.  HENT:  Head: Normocephalic and atraumatic.  Neck: Normal range of motion. Neck supple.  There is a 2.5 cm round, indurated area to the left of the posterior neck.  The skin overlying has been excoriated and the abscess has been unroofed.  Musculoskeletal: Normal range of motion.  Neurological: He is alert and oriented to person, place, and time.  Skin: Skin is warm and dry. He is not diaphoretic.  Nursing note and vitals reviewed.   ED Course  Procedures (including critical care time) Labs Review Labs Reviewed - No data to display  Imaging Review No results found.   EKG Interpretation None      MDM   Final diagnoses:  None    The wound appears to be unroofed already. I am unable to appreciate any fluctuance. Will treat with Keflex, Bactrim, pain medication. I've also advised warm soaks as frequent as possible for the next several days. The return as needed if symptoms worsen.    Veryl Speak, MD 07/23/14 1151  Veryl Speak, MD 07/23/14 1155

## 2014-07-23 NOTE — Discharge Instructions (Signed)
Keflex and Bactrim as prescribed.  Hydrocodone as prescribed as needed for pain.  Apply warm soaks as frequently as possible for the next 2-3 days, and return to the ER if your symptoms substantially worsen or change.   Abscess An abscess is an infected area that contains a collection of pus and debris.It can occur in almost any part of the body. An abscess is also known as a furuncle or boil. CAUSES  An abscess occurs when tissue gets infected. This can occur from blockage of oil or sweat glands, infection of hair follicles, or a minor injury to the skin. As the body tries to fight the infection, pus collects in the area and creates pressure under the skin. This pressure causes pain. People with weakened immune systems have difficulty fighting infections and get certain abscesses more often.  SYMPTOMS Usually an abscess develops on the skin and becomes a painful mass that is red, warm, and tender. If the abscess forms under the skin, you may feel a moveable soft area under the skin. Some abscesses break open (rupture) on their own, but most will continue to get worse without care. The infection can spread deeper into the body and eventually into the bloodstream, causing you to feel ill.  DIAGNOSIS  Your caregiver will take your medical history and perform a physical exam. A sample of fluid may also be taken from the abscess to determine what is causing your infection. TREATMENT  Your caregiver may prescribe antibiotic medicines to fight the infection. However, taking antibiotics alone usually does not cure an abscess. Your caregiver may need to make a small cut (incision) in the abscess to drain the pus. In some cases, gauze is packed into the abscess to reduce pain and to continue draining the area. HOME CARE INSTRUCTIONS   Only take over-the-counter or prescription medicines for pain, discomfort, or fever as directed by your caregiver.  If you were prescribed antibiotics, take them as  directed. Finish them even if you start to feel better.  If gauze is used, follow your caregiver's directions for changing the gauze.  To avoid spreading the infection:  Keep your draining abscess covered with a bandage.  Wash your hands well.  Do not share personal care items, towels, or whirlpools with others.  Avoid skin contact with others.  Keep your skin and clothes clean around the abscess.  Keep all follow-up appointments as directed by your caregiver. SEEK MEDICAL CARE IF:   You have increased pain, swelling, redness, fluid drainage, or bleeding.  You have muscle aches, chills, or a general ill feeling.  You have a fever. MAKE SURE YOU:   Understand these instructions.  Will watch your condition.  Will get help right away if you are not doing well or get worse. Document Released: 04/13/2005 Document Revised: 01/03/2012 Document Reviewed: 09/16/2011 [facility] Patient Information 2015 [place], [place]. This information is not intended to replace advice given to you by your health care provider. Make sure you discuss any questions you have with your health care provider.

## 2014-07-23 NOTE — ED Notes (Signed)
MD at bedside. 

## 2015-01-12 ENCOUNTER — Emergency Department (HOSPITAL_BASED_OUTPATIENT_CLINIC_OR_DEPARTMENT_OTHER)
Admission: EM | Admit: 2015-01-12 | Discharge: 2015-01-12 | Disposition: A | Discharge status: Home / Self Care | Payer: Self-pay | Attending: Emergency Medicine | Admitting: Emergency Medicine

## 2015-01-12 ENCOUNTER — Encounter (HOSPITAL_BASED_OUTPATIENT_CLINIC_OR_DEPARTMENT_OTHER): Payer: Self-pay | Admitting: Emergency Medicine

## 2015-01-12 DIAGNOSIS — R42 Dizziness and giddiness: Secondary | ICD-10-CM | POA: Insufficient documentation

## 2015-01-12 DIAGNOSIS — R011 Cardiac murmur, unspecified: Secondary | ICD-10-CM | POA: Insufficient documentation

## 2015-01-12 DIAGNOSIS — H538 Other visual disturbances: Secondary | ICD-10-CM | POA: Insufficient documentation

## 2015-01-12 DIAGNOSIS — R350 Frequency of micturition: Secondary | ICD-10-CM | POA: Insufficient documentation

## 2015-01-12 DIAGNOSIS — R1013 Epigastric pain: Secondary | ICD-10-CM

## 2015-01-12 DIAGNOSIS — K219 Gastro-esophageal reflux disease without esophagitis: Secondary | ICD-10-CM | POA: Insufficient documentation

## 2015-01-12 DIAGNOSIS — R112 Nausea with vomiting, unspecified: Secondary | ICD-10-CM

## 2015-01-12 DIAGNOSIS — G8929 Other chronic pain: Secondary | ICD-10-CM | POA: Insufficient documentation

## 2015-01-12 DIAGNOSIS — R3915 Urgency of urination: Secondary | ICD-10-CM | POA: Insufficient documentation

## 2015-01-12 LAB — URINALYSIS, ROUTINE W REFLEX MICROSCOPIC
Bilirubin Urine: NEGATIVE
GLUCOSE, UA: NEGATIVE mg/dL
Hgb urine dipstick: NEGATIVE
Ketones, ur: NEGATIVE mg/dL
LEUKOCYTES UA: NEGATIVE
NITRITE: NEGATIVE
PH: 7 (ref 5.0–8.0)
Protein, ur: NEGATIVE mg/dL
SPECIFIC GRAVITY, URINE: 1.014 (ref 1.005–1.030)
UROBILINOGEN UA: 0.2 mg/dL (ref 0.0–1.0)

## 2015-01-12 LAB — CBC WITH DIFFERENTIAL/PLATELET
Basophils Absolute: 0 10*3/uL (ref 0.0–0.1)
Basophils Relative: 0 % (ref 0–1)
EOS PCT: 2 % (ref 0–5)
Eosinophils Absolute: 0.1 10*3/uL (ref 0.0–0.7)
HEMATOCRIT: 41.7 % (ref 39.0–52.0)
HEMOGLOBIN: 14.1 g/dL (ref 13.0–17.0)
LYMPHS ABS: 2.8 10*3/uL (ref 0.7–4.0)
LYMPHS PCT: 30 % (ref 12–46)
MCH: 28.5 pg (ref 26.0–34.0)
MCHC: 33.8 g/dL (ref 30.0–36.0)
MCV: 84.2 fL (ref 78.0–100.0)
MONO ABS: 0.8 10*3/uL (ref 0.1–1.0)
MONOS PCT: 9 % (ref 3–12)
Neutro Abs: 5.7 10*3/uL (ref 1.7–7.7)
Neutrophils Relative %: 59 % (ref 43–77)
Platelets: 197 10*3/uL (ref 150–400)
RBC: 4.95 MIL/uL (ref 4.22–5.81)
RDW: 13.8 % (ref 11.5–15.5)
WBC: 9.5 10*3/uL (ref 4.0–10.5)

## 2015-01-12 LAB — COMPREHENSIVE METABOLIC PANEL
ALK PHOS: 73 U/L (ref 38–126)
ALT: 14 U/L — ABNORMAL LOW (ref 17–63)
ANION GAP: 10 (ref 5–15)
AST: 17 U/L (ref 15–41)
Albumin: 3.9 g/dL (ref 3.5–5.0)
BILIRUBIN TOTAL: 0.6 mg/dL (ref 0.3–1.2)
BUN: 17 mg/dL (ref 6–20)
CHLORIDE: 105 mmol/L (ref 101–111)
CO2: 26 mmol/L (ref 22–32)
CREATININE: 1.2 mg/dL (ref 0.61–1.24)
Calcium: 8.9 mg/dL (ref 8.9–10.3)
GFR calc non Af Amer: >60 mL/min (ref >60)
GLUCOSE: 106 mg/dL — AB (ref 65–99)
POTASSIUM: 4 mmol/L (ref 3.5–5.1)
Sodium: 141 mmol/L (ref 135–145)
Total Protein: 6.9 g/dL (ref 6.5–8.1)

## 2015-01-12 LAB — LIPASE, BLOOD: Lipase: 14 U/L — ABNORMAL LOW (ref 22–51)

## 2015-01-12 MED ORDER — PANTOPRAZOLE SODIUM 40 MG IV SOLR
40.0000 mg | Freq: Once | INTRAVENOUS | Status: AC
  Administered 2015-01-12: 40 mg via INTRAVENOUS
  Filled 2015-01-12: qty 40

## 2015-01-12 MED ORDER — OMEPRAZOLE 20 MG PO CPDR: 20.0000 mg | DELAYED_RELEASE_CAPSULE | Freq: Every day | ORAL | Status: DC

## 2015-01-12 MED ORDER — SODIUM CHLORIDE 0.9 % IV BOLUS (SEPSIS)
1000.0000 mL | Freq: Once | INTRAVENOUS | Status: AC
  Administered 2015-01-12: 1000 mL via INTRAVENOUS

## 2015-01-12 MED ORDER — MECLIZINE HCL 25 MG PO TABS: 25.0000 mg | ORAL_TABLET | Freq: Three times a day (TID) | ORAL | Status: DC | PRN

## 2015-01-12 MED ORDER — GI COCKTAIL ~~LOC~~
30.0000 mL | Freq: Once | ORAL | Status: AC
  Administered 2015-01-12: 30 mL via ORAL
  Filled 2015-01-12: qty 30

## 2015-01-12 MED ORDER — ONDANSETRON HCL 4 MG/2ML IJ SOLN
4.0000 mg | Freq: Once | INTRAMUSCULAR | Status: AC
  Administered 2015-01-12: 4 mg via INTRAVENOUS
  Filled 2015-01-12: qty 2

## 2015-01-12 NOTE — ED Notes (Signed)
MD at bedside. 

## 2015-01-12 NOTE — ED Notes (Signed)
PA at bedside.

## 2015-01-12 NOTE — ED Notes (Signed)
Has had recent trouble with eating- states has pain and nausea after eating- reports he was at work this morning and felt lightheaded, states he saw "stars" and was sent home- increased urinary frequency this week- has been taking ibuprofen for headache this week

## 2015-01-12 NOTE — ED Provider Notes (Signed)
CSN: [id number]     Arrival date & time 01/12/15  0910 History   First MD Initiated Contact with Patient 01/12/15 0919     Chief Complaint  Patient presents with  . Near Syncope    Don Rios is a 49 y.o. male with a history of chronic back pain and acid reflux who presents to the ED complaining of a near syncope, epigastric abdominal pain, nausea and vomiting ongoing for the past 5 days. Patient reports he's been having lots of trouble with eating recently and he has immediately after eating any food in his epigastric area. He reports worsening acid reflux symptoms with lots of burping and belching. Patient reports he started vomiting last night after dinner and vomited 3 times. He is currently complaining of lots of nausea. He also complains of 5 out of 10 epigastric abdominal pain, that is worse immediately after eating food. He reports that today after going to work he was feeling very lightheaded worse with position change. He reports he was attempting to install an item at his work when he started to have tunnel vision, room spinning dizziness, and his vision went black and he felt lightheaded. He then was picked up by his wife who brought him to the emergency department. He denies losing consciousness. He reports having tunnel vision while in route to the emergency department today. He reports increasing lightheadedness today. He denies room spinning dizziness currently. Patient denies previous abdominal surgeries. Patient does report having some urinary frequency and urgency over the past week. He reports one loose stool yesterday. The patient has taken nothing for treatment today. He denies any prodromal symptoms of chest pain or shortness of breath prior to the feeling of lightheadedness or room spinning dizziness. The patient denies fevers, chills, chest pain, shortness of breath, palpitations, loss of consciousness, headache, hematemesis, dysuria, hematuria, hematochezia, or rashes.   (Consider  location/radiation/quality/duration/timing/severity/associated sxs/prior Treatment) HPI  Past Medical History  Diagnosis Date  . Chronic back pain   . Heart murmur    Past Surgical History  Procedure Laterality Date  . Arm surgery     No family history on file. History  Substance Use Topics  . Smoking status: Never Smoker   . Smokeless tobacco: Not on file  . Alcohol Use: No    Review of Systems  Constitutional: Negative for fever, chills and unexpected weight change.  HENT: Negative for congestion and sore throat.   Eyes: Positive for visual disturbance.  Respiratory: Negative for cough, shortness of breath and wheezing.   Cardiovascular: Negative for chest pain and palpitations.  Gastrointestinal: Positive for nausea, vomiting, abdominal pain and diarrhea. Negative for blood in stool.  Genitourinary: Positive for urgency and frequency. Negative for dysuria, hematuria, flank pain and difficulty urinating.  Musculoskeletal: Negative for back pain and neck pain.  Skin: Negative for rash.  Neurological: Positive for light-headedness. Negative for dizziness, syncope, weakness, numbness and headaches.      Allergies  Review of patient's allergies indicates no known allergies.  Home Medications   Prior to Admission medications   Medication Sig Start Date End Date Taking? Authorizing Provider  meclizine (ANTIVERT) 25 MG tablet Take 1 tablet (25 mg total) by mouth 3 (three) times daily as needed for dizziness or nausea. 01/12/15   Waynetta Pean, PA-C  omeprazole (PRILOSEC) 20 MG capsule Take 1 capsule (20 mg total) by mouth daily. 01/12/15   Waynetta Pean, PA-C   BP 122/65 mmHg  Pulse 70  Temp(Src) 98 F (36.7 C)  Resp 13  SpO2 98% Physical Exam  Constitutional: He is oriented to person, place, and time. He appears well-developed and well-nourished. No distress.  Nontoxic appearing.  HENT:  Head: Normocephalic and atraumatic.  Right Ear: External ear normal.  Left  Ear: External ear normal.  Mouth/Throat: Oropharynx is clear and moist. No oropharyngeal exudate.  Eyes: Conjunctivae and EOM are normal. Pupils are equal, round, and reactive to light. Right eye exhibits no discharge. Left eye exhibits no discharge. Right eye exhibits normal extraocular motion and no nystagmus. Left eye exhibits normal extraocular motion and no nystagmus.  Neck: Normal range of motion. Neck supple. No JVD present. No tracheal deviation present.  Cardiovascular: Normal rate, regular rhythm, normal heart sounds and intact distal pulses.  Exam reveals no gallop and no friction rub.   Pulmonary/Chest: Effort normal and breath sounds normal. No respiratory distress. He has no wheezes. He has no rales.  Abdominal: Soft. Bowel sounds are normal. He exhibits no distension and no mass. There is tenderness. There is no rebound and no guarding.  Abdomen is soft. Bowel sounds are present. Patient has mild epigastric tenderness to palpation. No right upper quadrant tenderness. Negative Murphy's sign. Negative Rovsing sign. Negative psoas and obturator sign. No McBurney's point tenderness.  Musculoskeletal: He exhibits no edema.  Patient is spontaneously moving all extremities in a coordinated fashion exhibiting good strength.   Lymphadenopathy:    He has no cervical adenopathy.  Neurological: He is alert and oriented to person, place, and time. No cranial nerve deficit. Coordination normal.  Cranial nerves are intact. No pronator drift. He is able to handle it without difficulty or assistance. Finger-to-nose intact. Sensation is intact his bilateral upper and lower extremities.  Skin: Skin is warm and dry. No rash noted. He is not diaphoretic. No erythema. No pallor.  Psychiatric: He has a normal mood and affect. His behavior is normal.  Nursing note and vitals reviewed.   ED Course  Procedures (including critical care time) Labs Review Labs Reviewed  COMPREHENSIVE METABOLIC PANEL -  Abnormal; Notable for the following:    Glucose, Bld 106 (*)    ALT 14 (*)    All other components within normal limits  LIPASE, BLOOD - Abnormal; Notable for the following:    Lipase 14 (*)    All other components within normal limits  CBC WITH DIFFERENTIAL/PLATELET  URINALYSIS, ROUTINE W REFLEX MICROSCOPIC (NOT AT [facility])    Imaging Review No results found.   EKG Interpretation   Date/Time:  Monday January 12 2015 09:21:22 EDT Ventricular Rate:  82 PR Interval:  140 QRS Duration: 90 QT Interval:  358 QTC Calculation: 418 R Axis:   71 Text Interpretation:  Normal sinus rhythm Normal ECG No old tracing to  compare Confirmed by JACUBOWITZ  MD, SAM [phone]) on 01/12/2015 9:24:21 AM      Filed Vitals:   01/12/15 0915 01/12/15 1000 01/12/15 1105 01/12/15 1200  BP: 125/75 109/76 117/75 122/65  Pulse: 87 76 73 70  Temp: 98 F (36.7 C)     Resp: 14 12 18 13   SpO2: 97% 98% 100% 98%     MDM   Meds given in ED:  Medications  sodium chloride 0.9 % bolus 1,000 mL (0 mLs Intravenous Stopped 01/12/15 1112)  ondansetron (ZOFRAN) injection 4 mg (4 mg Intravenous Given 01/12/15 1007)  gi cocktail (Maalox,Lidocaine,Donnatal) (30 mLs Oral Given 01/12/15 1106)  pantoprazole (PROTONIX) injection 40 mg (40 mg Intravenous Given 01/12/15 1112)    New  Prescriptions   MECLIZINE (ANTIVERT) 25 MG TABLET    Take 1 tablet (25 mg total) by mouth 3 (three) times daily as needed for dizziness or nausea.   OMEPRAZOLE (PRILOSEC) 20 MG CAPSULE    Take 1 capsule (20 mg total) by mouth daily.    Final diagnoses:  Vertigo  Non-intractable vomiting with nausea, vomiting of unspecified type  Epigastric pain  Gastroesophageal reflux disease, esophagitis presence not specified   This is a 49 y.o. male with a history of chronic back pain and acid reflux who presents to the ED complaining of a near syncope, epigastric abdominal pain, nausea and vomiting ongoing for the past 5 days. Patient reports he's been  having lots of trouble with eating recently and he has immediately after eating any food in his epigastric area. He reports worsening acid reflux symptoms with lots of burping and belching. Patient reports he started vomiting last night after dinner and vomited 3 times. He is currently complaining of lots of nausea. He also complains of 5 out of 10 epigastric abdominal pain, that is worse immediately after eating food. He reports that today after going to work he was feeling very lightheaded worse with position change. He reports he was attempting to install an item at his work when he started to have tunnel vision, room spinning dizziness, and his vision went black and he felt lightheaded. Patient denies loss of consciousness, headache, changes to his vision, numbness, tingling or weakness. On exam the patient is afebrile and nontoxic-appearing. He has no focal neurological deficits. His heart is regular rate and rhythm. His abdomen is soft and he has mild epigastric tenderness to palpation. No right upper quadrant tenderness. Negative Murphy sign. No peritoneal signs. Will check blood work and give patient fluid bolus and Zofran. CMP is unremarkable. His liver enzymes are not elevated. His kidney function is normal. His lipase is 14. CBC is within normal limits. His urinalysis is unremarkable. Patient received fluid bolus and Zofran and reported great relief of nausea. I suspect possible gastric ulcer or acid reflux symptoms and as well as his possible peripheral vertigo. Patient given GI cocktail and Protonix in the ED. Patient reports his epigastric pain is completely resolved of my reevaluation. Patient has tolerated water in the emergency department without nausea or vomiting. We'll discharge the patient with prescriptions for meclizine and omeprazole. Education provided on diet for acid reflux symptoms. I also advised patient to follow-up with his primary care provider for further evaluation of his epigastric  pain. I advised the patient to follow-up with their primary care provider this week. I advised the patient to return to the emergency department with new or worsening symptoms or new concerns. The patient verbalized understanding and agreement with plan.    This patient was discussed with and evaluated by Dr. Winfred Leeds who agrees with assessment and plan.    Waynetta Pean, PA-C 01/12/15 1303  Orlie Dakin, MD 01/12/15 [phone]

## 2015-01-12 NOTE — ED Notes (Signed)
Pt started having dizziness on Thursday.  Vomiting on Sunday.  Near syncopal episode last night and this am.  Pt has been having epigastric pain for over 6 months but has worsened over last week.  Some chills and sweats.  Sometimes diarrhea.

## 2015-01-12 NOTE — ED Provider Notes (Signed)
Patient reports he got "overheated at work" or days ago and became lightheaded. He was fine until yesterday when he developed sensation of room spinning and nausea, and vomiting, no hematemesis. 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 No nystagmus neck supple lungs clear auscultation heart regular rate and rhythm abdomen nondistended nontender neurologic Glasgow Coma Score 15 finger-nose normal Romberg normal pronator drift normal gait normal Patient felt to be having vertigo, likely peripheral in etiology  Orlie Dakin, MD 01/12/15 1240

## 2015-01-12 NOTE — Discharge Instructions (Signed)
Benign Positional Vertigo Vertigo means you feel like you or your surroundings are moving when they are not. Benign positional vertigo is the most common form of vertigo. Benign means that the cause of your condition is not serious. Benign positional vertigo is more common in older adults. CAUSES  Benign positional vertigo is the result of an upset in the labyrinth system. This is an area in the middle ear that helps control your balance. This may be caused by a viral infection, head injury, or repetitive motion. However, often no specific cause is found. SYMPTOMS  Symptoms of benign positional vertigo occur when you move your head or eyes in different directions. Some of the symptoms may include:  Loss of balance and falls.  Vomiting.  Blurred vision.  Dizziness.  Nausea.  Involuntary eye movements (nystagmus). DIAGNOSIS  Benign positional vertigo is usually diagnosed by physical exam. If the specific cause of your benign positional vertigo is unknown, your caregiver may perform imaging tests, such as magnetic resonance imaging (MRI) or computed tomography (CT). TREATMENT  Your caregiver may recommend movements or procedures to correct the benign positional vertigo. Medicines such as meclizine, benzodiazepines, and medicines for nausea may be used to treat your symptoms. In rare cases, if your symptoms are caused by certain conditions that affect the inner ear, you may need surgery. HOME CARE INSTRUCTIONS   Follow your caregiver's instructions.  Move slowly. Do not make sudden body or head movements.  Avoid driving.  Avoid operating heavy machinery.  Avoid performing any tasks that would be dangerous to you or others during a vertigo episode.  Drink enough fluids to keep your urine clear or pale yellow. SEEK IMMEDIATE MEDICAL CARE IF:   You develop problems with walking, weakness, numbness, or using your arms, hands, or legs.  You have difficulty speaking.  You develop  severe headaches.  Your nausea or vomiting continues or gets worse.  You develop visual changes.  Your family or friends notice any behavioral changes.  Your condition gets worse.  You have a fever.  You develop a stiff neck or sensitivity to light. MAKE SURE YOU:   Understand these instructions.  Will watch your condition.  Will get help right away if you are not doing well or get worse. Document Released: 04/11/2006 Document Revised: 09/26/2011 Document Reviewed: 03/24/2011 [facility] Patient Information 2015 [place], [place]. This information is not intended to replace advice given to you by your health care provider. Make sure you discuss any questions you have with your health care provider. Food Choices for Gastroesophageal Reflux Disease When you have gastroesophageal reflux disease (GERD), the foods you eat and your eating habits are very important. Choosing the right foods can help ease the discomfort of GERD. WHAT GENERAL GUIDELINES DO I NEED TO FOLLOW?  Choose fruits, vegetables, whole grains, low-fat dairy products, and low-fat meat, fish, and poultry.  Limit fats such as oils, salad dressings, butter, nuts, and avocado.  Keep a food diary to identify foods that cause symptoms.  Avoid foods that cause reflux. These may be different for different people.  Eat frequent small meals instead of three large meals each day.  Eat your meals slowly, in a relaxed setting.  Limit fried foods.  Cook foods using methods other than frying.  Avoid drinking alcohol.  Avoid drinking large amounts of liquids with your meals.  Avoid bending over or lying down until 2-3 hours after eating. WHAT FOODS ARE NOT RECOMMENDED? The following are some foods and drinks that may  worsen your symptoms: Vegetables Tomatoes. Tomato juice. Tomato and spaghetti sauce. Chili peppers. Onion and garlic. Horseradish. Fruits Oranges, grapefruit, and lemon (fruit and juice). Meats High-fat  meats, fish, and poultry. This includes hot dogs, ribs, ham, sausage, salami, and bacon. Dairy Whole milk and chocolate milk. Sour cream. Cream. Butter. Ice cream. Cream cheese.  Beverages Coffee and tea, with or without caffeine. Carbonated beverages or energy drinks. Condiments Hot sauce. Barbecue sauce.  Sweets/Desserts Chocolate and cocoa. Donuts. Peppermint and spearmint. Fats and Oils High-fat foods, including [place] fries and potato chips. Other Vinegar. Strong spices, such as black pepper, white pepper, red pepper, cayenne, curry powder, cloves, ginger, and chili powder. The items listed above may not be a complete list of foods and beverages to avoid. Contact your dietitian for more information. Document Released: 07/04/2005 Document Revised: 07/09/2013 Document Reviewed: 05/08/2013 [facility] Patient Information 2015 [place], [place]. This information is not intended to replace advice given to you by your health care provider. Make sure you discuss any questions you have with your health care provider. Gastroesophageal Reflux Disease, Adult Gastroesophageal reflux disease (GERD) happens when acid from your stomach flows up into the esophagus. When acid comes in contact with the esophagus, the acid causes soreness (inflammation) in the esophagus. Over time, GERD may create small holes (ulcers) in the lining of the esophagus. CAUSES   Increased body weight. This puts pressure on the stomach, making acid rise from the stomach into the esophagus.  Smoking. This increases acid production in the stomach.  Drinking alcohol. This causes decreased pressure in the lower esophageal sphincter (valve or ring of muscle between the esophagus and stomach), allowing acid from the stomach into the esophagus.  Late evening meals and a full stomach. This increases pressure and acid production in the stomach.  A malformed lower esophageal sphincter. Sometimes, no cause is found. SYMPTOMS   Burning  pain in the lower part of the mid-chest behind the breastbone and in the mid-stomach area. This may occur twice a week or more often.  Trouble swallowing.  Sore throat.  Dry cough.  Asthma-like symptoms including chest tightness, shortness of breath, or wheezing. DIAGNOSIS  Your caregiver may be able to diagnose GERD based on your symptoms. In some cases, X-rays and other tests may be done to check for complications or to check the condition of your stomach and esophagus. TREATMENT  Your caregiver may recommend over-the-counter or prescription medicines to help decrease acid production. Ask your caregiver before starting or adding any new medicines.  HOME CARE INSTRUCTIONS   Change the factors that you can control. Ask your caregiver for guidance concerning weight loss, quitting smoking, and alcohol consumption.  Avoid foods and drinks that make your symptoms worse, such as:  Caffeine or alcoholic drinks.  Chocolate.  Peppermint or mint flavorings.  Garlic and onions.  Spicy foods.  Citrus fruits, such as oranges, lemons, or limes.  Tomato-based foods such as sauce, chili, salsa, and pizza.  Fried and fatty foods.  Avoid lying down for the 3 hours prior to your bedtime or prior to taking a nap.  Eat small, frequent meals instead of large meals.  Wear loose-fitting clothing. Do not wear anything tight around your waist that causes pressure on your stomach.  Raise the head of your bed 6 to 8 inches with wood blocks to help you sleep. Extra pillows will not help.  Only take over-the-counter or prescription medicines for pain, discomfort, or fever as directed by your caregiver.  Do not  take aspirin, ibuprofen, or other nonsteroidal anti-inflammatory drugs (NSAIDs). SEEK IMMEDIATE MEDICAL CARE IF:   You have pain in your arms, neck, jaw, teeth, or back.  Your pain increases or changes in intensity or duration.  You develop nausea, vomiting, or sweating  (diaphoresis).  You develop shortness of breath, or you faint.  Your vomit is green, yellow, black, or looks like coffee grounds or blood.  Your stool is red, bloody, or black. These symptoms could be signs of other problems, such as heart disease, gastric bleeding, or esophageal bleeding. MAKE SURE YOU:   Understand these instructions.  Will watch your condition.  Will get help right away if you are not doing well or get worse. Document Released: 04/13/2005 Document Revised: 09/26/2011 Document Reviewed: 01/21/2011 [facility] Patient Information 2015 [place], [place]. This information is not intended to replace advice given to you by your health care provider. Make sure you discuss any questions you have with your health care provider.  Abdominal Pain Many things can cause abdominal pain. Usually, abdominal pain is not caused by a disease and will improve without treatment. It can often be observed and treated at home. Your health care provider will do a physical exam and possibly order blood tests and X-rays to help determine the seriousness of your pain. However, in many cases, more time must pass before a clear cause of the pain can be found. Before that point, your health care provider may not know if you need more testing or further treatment. HOME CARE INSTRUCTIONS  Monitor your abdominal pain for any changes. The following actions may help to alleviate any discomfort you are experiencing:  Only take over-the-counter or prescription medicines as directed by your health care provider.  Do not take laxatives unless directed to do so by your health care provider.  Try a clear liquid diet (broth, tea, or water) as directed by your health care provider. Slowly move to a bland diet as tolerated. SEEK MEDICAL CARE IF:  You have unexplained abdominal pain.  You have abdominal pain associated with nausea or diarrhea.  You have pain when you urinate or have a bowel movement.  You  experience abdominal pain that wakes you in the night.  You have abdominal pain that is worsened or improved by eating food.  You have abdominal pain that is worsened with eating fatty foods.  You have a fever. SEEK IMMEDIATE MEDICAL CARE IF:   Your pain does not go away within 2 hours.  You keep throwing up (vomiting).  Your pain is felt only in portions of the abdomen, such as the right side or the left lower portion of the abdomen.  You pass bloody or black tarry stools. MAKE SURE YOU:  Understand these instructions.   Will watch your condition.   Will get help right away if you are not doing well or get worse.  Document Released: 04/13/2005 Document Revised: 07/09/2013 Document Reviewed: 03/13/2013 [facility] Patient Information 2015 [place], [place]. This information is not intended to replace advice given to you by your health care provider. Make sure you discuss any questions you have with your health care provider.

## 2016-01-17 ENCOUNTER — Emergency Department (HOSPITAL_BASED_OUTPATIENT_CLINIC_OR_DEPARTMENT_OTHER)
Admission: EM | Admit: 2016-01-17 | Discharge: 2016-01-17 | Disposition: A | Discharge status: Home / Self Care | Payer: Self-pay | Attending: Emergency Medicine | Admitting: Emergency Medicine

## 2016-01-17 ENCOUNTER — Emergency Department (HOSPITAL_BASED_OUTPATIENT_CLINIC_OR_DEPARTMENT_OTHER): Payer: Self-pay

## 2016-01-17 ENCOUNTER — Encounter (HOSPITAL_BASED_OUTPATIENT_CLINIC_OR_DEPARTMENT_OTHER): Payer: Self-pay | Admitting: *Deleted

## 2016-01-17 DIAGNOSIS — R51 Headache: Secondary | ICD-10-CM | POA: Insufficient documentation

## 2016-01-17 DIAGNOSIS — R0602 Shortness of breath: Secondary | ICD-10-CM | POA: Insufficient documentation

## 2016-01-17 DIAGNOSIS — R519 Headache, unspecified: Secondary | ICD-10-CM

## 2016-01-17 DIAGNOSIS — R079 Chest pain, unspecified: Secondary | ICD-10-CM

## 2016-01-17 DIAGNOSIS — R0981 Nasal congestion: Secondary | ICD-10-CM | POA: Insufficient documentation

## 2016-01-17 LAB — CBC WITH DIFFERENTIAL/PLATELET
Basophils Absolute: 0 10*3/uL (ref 0.0–0.1)
Basophils Relative: 0 %
EOS PCT: 3 %
Eosinophils Absolute: 0.3 10*3/uL (ref 0.0–0.7)
HEMATOCRIT: 41.8 % (ref 39.0–52.0)
Hemoglobin: 14.2 g/dL (ref 13.0–17.0)
LYMPHS ABS: 3.3 10*3/uL (ref 0.7–4.0)
LYMPHS PCT: 37 %
MCH: 28.6 pg (ref 26.0–34.0)
MCHC: 34 g/dL (ref 30.0–36.0)
MCV: 84.1 fL (ref 78.0–100.0)
MONO ABS: 0.8 10*3/uL (ref 0.1–1.0)
Monocytes Relative: 9 %
NEUTROS ABS: 4.5 10*3/uL (ref 1.7–7.7)
Neutrophils Relative %: 51 %
PLATELETS: 225 10*3/uL (ref 150–400)
RBC: 4.97 MIL/uL (ref 4.22–5.81)
RDW: 13.4 % (ref 11.5–15.5)
WBC: 8.8 10*3/uL (ref 4.0–10.5)

## 2016-01-17 LAB — BASIC METABOLIC PANEL
ANION GAP: 7 (ref 5–15)
BUN: 16 mg/dL (ref 6–20)
CO2: 24 mmol/L (ref 22–32)
CREATININE: 1.27 mg/dL — AB (ref 0.61–1.24)
Calcium: 8.5 mg/dL — ABNORMAL LOW (ref 8.9–10.3)
Chloride: 107 mmol/L (ref 101–111)
GFR calc Af Amer: >60 mL/min (ref >60)
Glucose, Bld: 102 mg/dL — ABNORMAL HIGH (ref 65–99)
Potassium: 4 mmol/L (ref 3.5–5.1)
SODIUM: 138 mmol/L (ref 135–145)

## 2016-01-17 LAB — TROPONIN I: Troponin I: <0.03 ng/mL (ref <0.03)

## 2016-01-17 MED ORDER — IBUPROFEN 800 MG PO TABS
800.0000 mg | ORAL_TABLET | Freq: Once | ORAL | Status: AC
  Administered 2016-01-17: 800 mg via ORAL
  Filled 2016-01-17: qty 1

## 2016-01-17 NOTE — Discharge Instructions (Signed)
Read the information below.   Your labwork, EKG, and chest x-ray were re-assuring. You have a mild elevation in your creatinine. I encourage you to drink plenty of fluid.  You can take tylenol or ibuprofen for relief of syptoms.  I encourage you to follow up with cardiology. I have provided the contact information for cardiology. Please call to schedule an appointment.  I have also provided the contact information for [facility] to establish a primary care provider. Please call to schedule a follow up appointment.  Use the prescribed medication as directed.  Please discuss all new medications with your pharmacist.   You may return to the Emergency Department at any time for worsening condition or any new symptoms that concern you. Return to ED if your symptoms worsen you develop shortness of breath, chest pain, facial droop, slurred speech, numbness, weakness, or loss of consciousness.    Nonspecific Chest Pain  Chest pain can be caused by many different conditions. There is always a chance that your pain could be related to something serious, such as a heart attack or a blood clot in your lungs. Chest pain can also be caused by conditions that are not life-threatening. If you have chest pain, it is very important to follow up with your health care provider. CAUSES  Chest pain can be caused by:  Heartburn.  Pneumonia or bronchitis.  Anxiety or stress.  Inflammation around your heart (pericarditis) or lung (pleuritis or pleurisy).  A blood clot in your lung.  A collapsed lung (pneumothorax). It can develop suddenly on its own (spontaneous pneumothorax) or from trauma to the chest.  Shingles infection (varicella-zoster virus).  Heart attack.  Damage to the bones, muscles, and cartilage that make up your chest wall. This can include:  Bruised bones due to injury.  Strained muscles or cartilage due to frequent or repeated coughing or overwork.  Fracture to one or more  ribs.  Sore cartilage due to inflammation (costochondritis). RISK FACTORS  Risk factors for chest pain may include:  Activities that increase your risk for trauma or injury to your chest.  Respiratory infections or conditions that cause frequent coughing.  Medical conditions or overeating that can cause heartburn.  Heart disease or family history of heart disease.  Conditions or health behaviors that increase your risk of developing a blood clot.  Having had chicken pox (varicella zoster). SIGNS AND SYMPTOMS Chest pain can feel like:  Burning or tingling on the surface of your chest or deep in your chest.  Crushing, pressure, aching, or squeezing pain.  Dull or sharp pain that is worse when you move, cough, or take a deep breath.  Pain that is also felt in your back, neck, shoulder, or arm, or pain that spreads to any of these areas. Your chest pain may come and go, or it may stay constant. DIAGNOSIS Lab tests or other studies may be needed to find the cause of your pain. Your health care provider may have you take a test called an ambulatory ECG (electrocardiogram). An ECG records your heartbeat patterns at the time the test is performed. You may also have other tests, such as:  Transthoracic echocardiogram (TTE). During echocardiography, sound waves are used to create a picture of all of the heart structures and to look at how blood flows through your heart.  Transesophageal echocardiogram (TEE).This is a more advanced imaging test that obtains images from inside your body. It allows your health care provider to see your heart  in finer detail.  Cardiac monitoring. This allows your health care provider to monitor your heart rate and rhythm in real time.  Holter monitor. This is a portable device that records your heartbeat and can help to diagnose abnormal heartbeats. It allows your health care provider to track your heart activity for several days, if needed.  Stress tests.  These can be done through exercise or by taking medicine that makes your heart beat more quickly.  Blood tests.  Imaging tests. TREATMENT  Your treatment depends on what is causing your chest pain. Treatment may include:  Medicines. These may include:  Acid blockers for heartburn.  Anti-inflammatory medicine.  Pain medicine for inflammatory conditions.  Antibiotic medicine, if an infection is present.  Medicines to dissolve blood clots.  Medicines to treat coronary artery disease.  Supportive care for conditions that do not require medicines. This may include:  Resting.  Applying heat or cold packs to injured areas.  Limiting activities until pain decreases. HOME CARE INSTRUCTIONS  If you were prescribed an antibiotic medicine, finish it all even if you start to feel better.  Avoid any activities that bring on chest pain.  Do not use any tobacco products, including cigarettes, chewing tobacco, or electronic cigarettes. If you need help quitting, ask your health care provider.  Do not drink alcohol.  Take medicines only as directed by your health care provider.  Keep all follow-up visits as directed by your health care provider. This is important. This includes any further testing if your chest pain does not go away.  If heartburn is the cause for your chest pain, you may be told to keep your head raised (elevated) while sleeping. This reduces the chance that acid will go from your stomach into your esophagus.  Make lifestyle changes as directed by your health care provider. These may include:  Getting regular exercise. Ask your health care provider to suggest some activities that are safe for you.  Eating a heart-healthy diet. A registered dietitian can help you to learn healthy eating options.  Maintaining a healthy weight.  Managing diabetes, if necessary.  Reducing stress. SEEK MEDICAL CARE IF:  Your chest pain does not go away after treatment.  You have  a rash with blisters on your chest.  You have a fever. SEEK IMMEDIATE MEDICAL CARE IF:   Your chest pain is worse.  You have an increasing cough, or you cough up blood.  You have severe abdominal pain.  You have severe weakness.  You faint.  You have chills.  You have sudden, unexplained chest discomfort.  You have sudden, unexplained discomfort in your arms, back, neck, or jaw.  You have shortness of breath at any time.  You suddenly start to sweat, or your skin gets clammy.  You feel nauseous or you vomit.  You suddenly feel light-headed or dizzy.  Your heart begins to beat quickly, or it feels like it is skipping beats. These symptoms may represent a serious problem that is an emergency. Do not wait to see if the symptoms will go away. Get medical help right away. Call your local emergency services (911 in the [place] Do not drive yourself to the hospital.   This information is not intended to replace advice given to you by your health care provider. Make sure you discuss any questions you have with your health care provider.   Document Released: 04/13/2005 Document Revised: 07/25/2014 Document Reviewed: 02/07/2014 Elsevier Interactive Patient Education [employer].

## 2016-01-17 NOTE — ED Provider Notes (Signed)
CSN: [id number]     Arrival date & time 01/17/16  1327 History   First MD Initiated Contact with Patient 01/17/16 1413     Chief Complaint  Patient presents with  . knot under his left arm      (Consider location/radiation/quality/duration/timing/severity/associated sxs/prior Treatment) HPI Comments: Don Rios is a 50 y.o. male with history of heart murmur presents to ED with complaint of knot in left axillary. Patient first noticed know approximately 1 month ago. States it is tender to touch. Of note, pt also endorses intermittent sharp left axillary, non-radiating chest pain x 2 months. No identifiable pattern regarding onset of pain. Pain is worse with deep inspiration and with palpation of chest wall. No trauma to area. He does have associated  Intermittent shortness of breath, cough with clear sputum, and intermittent left arm numbness. Currently no arm numbness. He has tried ibuprofen with some relief of symptoms.  No fever. No abdominal complaints. No urinary complaints. Denies h/o blood clots, h/o long distance travel/surgery/immobilization, cancer or cancer treatment, hemoptysis, unilateral leg swelling or pain.   Pt also endorses a headache, gradual in onset, 10/10, right sided, throbbing in nature with associated nausea. Denies neurologic symptoms. No LOC. No fever. Pt endorses frequent headaches and this headache is the exact same as previous. Headache resolves typically with ibuprofen.   The history is provided by the patient and medical records.    Past Medical History  Diagnosis Date  . Chronic back pain   . Heart murmur    Past Surgical History  Procedure Laterality Date  . Arm surgery     No family history on file. Social History  Substance Use Topics  . Smoking status: Never Smoker   . Smokeless tobacco: None  . Alcohol Use: Yes     Comment: rare    Review of Systems  HENT: Positive for congestion.   Respiratory: Positive for shortness of breath ( intermittent).    Cardiovascular: Positive for chest pain ( intermittent).  Neurological: Positive for headaches.      Allergies  Review of patient's allergies indicates no known allergies.  Home Medications   Prior to Admission medications   Medication Sig Start Date End Date Taking? Authorizing Provider  meclizine (ANTIVERT) 25 MG tablet Take 1 tablet (25 mg total) by mouth 3 (three) times daily as needed for dizziness or nausea. 01/12/15   Waynetta Pean, PA-C  omeprazole (PRILOSEC) 20 MG capsule Take 1 capsule (20 mg total) by mouth daily. 01/12/15   Waynetta Pean, PA-C   BP 123/79 mmHg  Pulse 69  Temp(Src) 98.1 F (36.7 C) (Oral)  Resp 14  Ht 5\' 11"  (1.803 m)  Wt 90.719 kg  BMI 27.91 kg/m2  SpO2 99% Physical Exam  Constitutional: He appears well-developed and well-nourished. No distress.  HENT:  Head: Normocephalic and atraumatic.  Mouth/Throat: Oropharynx is clear and moist. No oropharyngeal exudate.  Eyes: Conjunctivae and EOM are normal. Pupils are equal, round, and reactive to light. Right eye exhibits no discharge. Left eye exhibits no discharge. No scleral icterus.  Neck: Normal range of motion and phonation normal. Neck supple. No rigidity. Normal range of motion present.  Cardiovascular: Normal rate, regular rhythm, normal heart sounds and intact distal pulses.   No murmur heard. Pulmonary/Chest: Effort normal and breath sounds normal. No respiratory distress.  Abdominal: Soft. Bowel sounds are normal. There is no tenderness. There is no rebound and no guarding.  Musculoskeletal: Normal range of motion.  Lymphadenopathy:    He  has no cervical adenopathy.  Neurological: He is alert. Coordination normal.  Mental Status:  Alert, thought content appropriate, able to give a coherent history. Speech fluent without evidence of aphasia. Able to follow 2 step commands without difficulty.  Cranial Nerves:  II:  Peripheral visual fields grossly normal, pupils equal, round, reactive to  light III,IV, VI: ptosis not present, extra-ocular motions intact bilaterally  V,VII: smile symmetric, facial light touch sensation equal VIII: hearing grossly normal to voice  X: uvula elevates symmetrically  XI: bilateral shoulder shrug symmetric and strong XII: midline tongue extension without fassiculations Motor:  Normal tone. 5/5 in upper and lower extremities bilaterally including strong and equal grip strength and dorsiflexion/plantar flexion Sensory: light touch normal in all extremities.  Cerebellar: normal finger-to-nose with bilateral upper extremities Gait: normal gait and balance CV: distal pulses palpable throughout   Skin: Skin is warm and dry. He is not diaphoretic.  Pt is mildly TTP in left axillary. No appreciable mass. No warmth or erythema. No appreciable abscess noted.   Psychiatric: He has a normal mood and affect. His behavior is normal.    ED Course  Procedures (including critical care time) Labs Review Labs Reviewed  BASIC METABOLIC PANEL - Abnormal; Notable for the following:    Glucose, Bld 102 (*)    Creatinine, Ser 1.27 (*)    Calcium 8.5 (*)    All other components within normal limits  CBC WITH DIFFERENTIAL/PLATELET  TROPONIN I    Imaging Review Dg Chest 2 View  01/17/2016  CLINICAL DATA:  Left lateral chest pain with shortness of breath and cough. EXAM: CHEST  2 VIEW COMPARISON:  09/02/2013 FINDINGS: AP and lateral views of the chest show no focal airspace consolidation. No pulmonary edema or pleural effusion. The cardiopericardial silhouette is within normal limits for size. Posterior left lower lobe granuloma seen on previous studies is barely visible superimposed on the left hemi diaphragmatic dome today. Telemetry leads overlie the chest. IMPRESSION: No active cardiopulmonary disease. Electronically Signed   By: Misty Stanley M.D.   On: 01/17/2016 16:03   I have personally reviewed and evaluated these images and lab results as part of my medical  decision-making.   EKG Interpretation   Date/Time:  Sunday January 17 2016 13:48:12 EDT Ventricular Rate:  77 PR Interval:    QRS Duration: 88 QT Interval:  364 QTC Calculation: 412 R Axis:   74 Text Interpretation:  Sinus rhythm No significant change since last  tracing Confirmed by YAO  MD, DAVID ([id number]) on 01/17/2016 2:08:47 PM Also  confirmed by Lita Mains  MD, DAVID ([id number])  on 01/17/2016 4:39:43 PM      MDM   Final diagnoses:  Chest pain, unspecified chest pain type  Nonintractable headache, unspecified chronicity pattern, unspecified headache type   Patient is afebrile and non-toxic appearing in NAD. Vital signs are stable. Physical exam remarkable for TTP of left axillary chest wall. Normal neurologic exam. EKG NSR with no significant change from previous. Troponin negative. Heart score 1. CXR negative for PNA, PTX, or pleural effusion. Intact pulses b/l, low suspicion for dissection. No free air under diaphragm on x-ray, low suspicion for esophageal rupture. Well's 0. PERC negative, low suspicion for PE. Re-assuring that pain is reproducible with palpation. Unsure etiology - ?MSK. F/u with cardiology. Regarding knot in left axillary. On exam no mass appreciated. There is some tenderness. No warmth or erythema. No abscess noted. ?reactive lymph node. Encouraged f/u with PCP.   Regarding headache - Pt  HA treated and improved while in ED.  Presentation is like pts typical HA and non concerning for [facility], ICH, Meningitis. Pt is afebrile with no focal neuro deficits, nuchal rigidity, or change in vision. Pt is to follow up with PCP to discuss prophylactic medication.   Discussed results and plan with pt. Discussed importance of establishing a PCP for continuity of care. Provided resources for PCP. Provided return precautions. Pt voiced understanding and is agreeable.    Roxanna Mew, [place] 01/17/16 1806  Julianne Rice, MD 01/19/16 0005

## 2016-01-17 NOTE — ED Notes (Signed)
C/o "knot under left arm" for past 30 minutes. States that he has had pain under his left arm for past several months, but states also it radiates into his left anterior chest. States that he has felt sob at times. States pain is constant and describes as dull. Denies any n/v. Denies any fevers. States he has had cough and congestion for past several months.

## 2016-01-17 NOTE — ED Notes (Signed)
Pt placed on auto vitals Q30. Patient placed on cardiac monitor.  

## 2016-04-05 ENCOUNTER — Emergency Department (HOSPITAL_BASED_OUTPATIENT_CLINIC_OR_DEPARTMENT_OTHER): Payer: BLUE CROSS/BLUE SHIELD

## 2016-04-05 ENCOUNTER — Emergency Department (HOSPITAL_BASED_OUTPATIENT_CLINIC_OR_DEPARTMENT_OTHER)
Admission: EM | Admit: 2016-04-05 | Discharge: 2016-04-05 | Disposition: A | Discharge status: Home / Self Care | Payer: BLUE CROSS/BLUE SHIELD | Attending: Emergency Medicine | Admitting: Emergency Medicine

## 2016-04-05 ENCOUNTER — Encounter (HOSPITAL_BASED_OUTPATIENT_CLINIC_OR_DEPARTMENT_OTHER): Payer: Self-pay | Admitting: *Deleted

## 2016-04-05 DIAGNOSIS — J069 Acute upper respiratory infection, unspecified: Secondary | ICD-10-CM | POA: Diagnosis not present

## 2016-04-05 DIAGNOSIS — R0789 Other chest pain: Secondary | ICD-10-CM | POA: Insufficient documentation

## 2016-04-05 DIAGNOSIS — R0981 Nasal congestion: Secondary | ICD-10-CM | POA: Diagnosis present

## 2016-04-05 MED ORDER — AZITHROMYCIN 250 MG PO TABS: 250.0000 mg | ORAL_TABLET | Freq: Every day | ORAL | 0 refills | Status: DC

## 2016-04-05 NOTE — ED Triage Notes (Signed)
Pt c/o URI symptoms x 2 days 

## 2016-04-05 NOTE — ED Provider Notes (Signed)
[facility] Provider Note   CSN: [id number] Arrival date & time: 04/05/16  2006  By signing my name below, I, Don Rios, attest that this documentation has been prepared under the direction and in the presence of Don Circle, PA-C. Electronically Signed: Judithann Rios, ED Scribe. 04/05/16. 10:39 PM.   History   Chief Complaint Chief Complaint  Patient presents with  . URI    HPI Comments: Don Rios is a 50 y.o. male who presents to the Emergency Department complaining of gradually worsening moderate nasal congestion onset 2 days ago. He reports associated rhinorrhea, difficulty breathing, chest tightness, and bilateral eye pain with watery discharge.  No alleviating factors noted. Pt has tried OTC Robitussin with no relief. He denies a hx of asthma or COPD. He denies any fever, chills, nausea, or vomiting.   The history is provided by the patient. No language interpreter was used.    Past Medical History:  Diagnosis Date  . Chronic back pain   . Heart murmur     There are no active problems to display for this patient.   Past Surgical History:  Procedure Laterality Date  . arm surgery         Home Medications    Prior to Admission medications   Not on File    Family History History reviewed. No pertinent family history.  Social History Social History  Substance Use Topics  . Smoking status: Never Smoker  . Smokeless tobacco: Not on file  . Alcohol use Yes     Comment: rare     Allergies   Review of patient's allergies indicates no known allergies.   Review of Systems Review of Systems  Constitutional: Negative for chills and fever.  HENT: Positive for congestion and rhinorrhea.   Eyes: Positive for pain and discharge.  Respiratory: Positive for chest tightness and shortness of breath.      Physical Exam Updated Vital Signs BP 112/81 (BP Location: Left Arm)   Pulse 97   Temp 98.2 F (36.8 C) (Oral)   Ht 5\' 11"   (1.803 m)   Wt 200 lb (90.7 kg)   SpO2 99%   BMI 27.89 kg/m   Physical Exam Physical Exam  Constitutional: Pt  is oriented to person, place, and time. Appears well-developed and well-nourished. No distress.  HENT:  Head: Normocephalic and atraumatic.  Right Ear: Tympanic membrane, external ear and ear canal normal.  Left Ear: Tympanic membrane, external ear and ear canal normal.  Nose: Mucosal edema and moderate rhinorrhea present. No epistaxis. Right sinus exhibits no maxillary sinus tenderness and no frontal sinus tenderness. Left sinus exhibits no maxillary sinus tenderness and no frontal sinus tenderness.  Mouth/Throat: Uvula is midline and mucous membranes are normal. Mucous membranes are not pale and not cyanotic. No oropharyngeal exudate, posterior oropharyngeal edema, posterior oropharyngeal erythema or tonsillar abscesses.  Eyes: Conjunctivae are normal. Pupils are equal, round, and reactive to light.  Neck: Normal range of motion and full passive range of motion without pain.  Cardiovascular: Normal rate and intact distal pulses.   Pulmonary/Chest: Effort normal and breath sounds normal. No stridor.  Clear and equal breath sounds without focal wheezes, rhonchi, rales  Abdominal: Soft. Bowel sounds are normal. There is no tenderness.  Musculoskeletal: Normal range of motion.  Lymphadenopathy:    Pthas no cervical adenopathy.  Neurological: Pt is alert and oriented to person, place, and time.  Skin: Skin is warm and dry. No rash noted. Pt is not diaphoretic.  Psychiatric: Normal mood and affect.  Nursing note and vitals reviewed.  ED Treatments / Results  DIAGNOSTIC STUDIES: Oxygen Saturation is 99% on RA, normal by my interpretation.    COORDINATION OF CARE: 10:27 PM- Pt advised of plan for treatment and pt agrees. Pt will receive chest x-ray for further evaluation.    Labs (all labs ordered are listed, but only abnormal results are displayed) Labs Reviewed - No data to  display  EKG  EKG Interpretation None       Radiology No results found.  Procedures Procedures (including critical care time)  Medications Ordered in ED Medications - No data to display   Initial Impression / Assessment and Plan / ED Course  Don Circle, PA-C has reviewed the triage vital signs and the nursing notes.  Pertinent labs & imaging results that were available during my care of the patient were reviewed by me and considered in my medical decision making (see chart for details).  Clinical Course    Pt CXR negative for acute infiltrate. Patients symptoms are consistent with URI, likely viral etiology. Discussed that antibiotics are not indicated for viral infections. Pt will be discharged with symptomatic treatment.  Verbalizes understanding and is agreeable with plan. Pt is hemodynamically stable & in NAD prior to dc.   Final Clinical Impressions(s) / ED Diagnoses   Final diagnoses:  URI (upper respiratory infection)    New Prescriptions New Prescriptions   No medications on file   I personally performed the services described in this documentation, which was scribed in my presence. The recorded information has been reviewed and is accurate.      Don Circle, PA-C 04/06/16 0010    Don Biles, MD 04/06/16 [id number]

## 2016-04-17 ENCOUNTER — Encounter (HOSPITAL_BASED_OUTPATIENT_CLINIC_OR_DEPARTMENT_OTHER): Payer: Self-pay | Admitting: *Deleted

## 2016-04-17 ENCOUNTER — Emergency Department (HOSPITAL_BASED_OUTPATIENT_CLINIC_OR_DEPARTMENT_OTHER)
Admission: EM | Admit: 2016-04-17 | Discharge: 2016-04-18 | Disposition: A | Discharge status: Home / Self Care | Payer: BLUE CROSS/BLUE SHIELD | Attending: Emergency Medicine | Admitting: Emergency Medicine

## 2016-04-17 DIAGNOSIS — M5442 Lumbago with sciatica, left side: Secondary | ICD-10-CM | POA: Insufficient documentation

## 2016-04-17 DIAGNOSIS — M5432 Sciatica, left side: Secondary | ICD-10-CM

## 2016-04-17 DIAGNOSIS — M545 Low back pain: Secondary | ICD-10-CM | POA: Diagnosis present

## 2016-04-17 NOTE — ED Triage Notes (Signed)
Left left pain going down his buttock and into his groin that started while having sex. Worse pain with movement.

## 2016-04-18 MED ORDER — NAPROXEN 250 MG PO TABS
500.0000 mg | ORAL_TABLET | Freq: Once | ORAL | Status: AC
  Administered 2016-04-18: 500 mg via ORAL
  Filled 2016-04-18: qty 2

## 2016-04-18 MED ORDER — HYDROMORPHONE HCL 1 MG/ML IJ SOLN
2.0000 mg | Freq: Once | INTRAMUSCULAR | Status: AC
  Administered 2016-04-18: 2 mg via INTRAMUSCULAR
  Filled 2016-04-18: qty 2

## 2016-04-18 MED ORDER — OXYCODONE-ACETAMINOPHEN 5-325 MG PO TABS: 1.0000 | ORAL_TABLET | Freq: Four times a day (QID) | ORAL | 0 refills | Status: DC | PRN

## 2016-04-18 MED ORDER — ONDANSETRON 8 MG PO TBDP
8.0000 mg | ORAL_TABLET | Freq: Once | ORAL | Status: AC
  Administered 2016-04-18: 8 mg via ORAL
  Filled 2016-04-18: qty 1

## 2016-04-18 NOTE — ED Notes (Signed)
EDP into room, at BS.  ?

## 2016-04-18 NOTE — ED Provider Notes (Signed)
[facility] Provider Note: Don Spurling, MD, FACEP  CSN: [id number] MRN: [id number] ARRIVAL: 04/17/16 at 2248   CHIEF COMPLAINT  No chief complaint on file.   HISTORY OF PRESENT ILLNESS  Don Rios is a 50 y.o. male with a history of sciatica. He is here with left-sided lumbar pain that began 2 days ago and is gradually worsened. It is now severe. The pain is sharp and radiates to his left thigh and groin. It is worse with movement of the lower back or with movement of the right hip. He denies bowel or bladder changes. He denies numbness or weakness.  A review of the [place] controlled substances database reveals he has had only 2 narcotic prescriptions in the past year, one for oxycodone one for tramadol.   Past Medical History:  Diagnosis Date  . Chronic back pain   . Heart murmur     Past Surgical History:  Procedure Laterality Date  . arm surgery      History reviewed. No pertinent family history.  Social History  Substance Use Topics  . Smoking status: Never Smoker  . Smokeless tobacco: Never Used  . Alcohol use Yes     Comment: rare    Prior to Admission medications   Medication Sig Start Date End Date Taking? Authorizing Provider  oxyCODONE-acetaminophen (PERCOCET) 5-325 MG tablet Take 1-2 tablets by mouth every 6 (six) hours as needed for severe pain. 04/18/16   Shanon Rosser, MD    Allergies Review of patient's allergies indicates no known allergies.   REVIEW OF SYSTEMS  Negative except as noted here or in the History of Present Illness.   PHYSICAL EXAMINATION  Initial Vital Signs Blood pressure 137/73, pulse 73, temperature 97.5 F (36.4 C), temperature source Oral, resp. rate 16, height 5\' 11"  (1.803 m), weight 200 lb (90.7 kg), SpO2 100 %.  Examination General: Well-developed, well-nourished male in no acute distress; appearance consistent with age of record HENT: normocephalic; atraumatic Eyes: pupils equal, round and reactive  to light; extraocular muscles intact Neck: supple Heart: regular rate and rhythm Lungs: clear to auscultation bilaterally Abdomen: soft; nondistended; nontender; bowel sounds present GU: Left groin tenderness without hernia, mass or swelling Back: Left paralumbar tenderness; negative straight leg raise on the left, positive straight leg raise on the right at about 10 Extremities: No deformity; full range of motion; pulses normal Neurologic: Awake, alert and oriented; motor function intact in all extremities and symmetric; sensation intact in the lower extremities and symmetric no facial droop Skin: Warm and dry Psychiatric: Normal mood and affect   RESULTS  Summary of this visit's results, reviewed by myself:   EKG Interpretation  Date/Time:    Ventricular Rate:    PR Interval:    QRS Duration:   QT Interval:    QTC Calculation:   R Axis:     Text Interpretation:        Laboratory Studies: No results found for this or any previous visit (from the past 24 hour(s)). Imaging Studies: No results found.  ED COURSE  Nursing notes and initial vitals signs, including pulse oximetry, reviewed.  Vitals:   04/17/16 2254 04/17/16 2255  BP:  137/73  Pulse:  73  Resp:  16  Temp:  97.5 F (36.4 C)  TempSrc:  Oral  SpO2:  100%  Weight: 200 lb (90.7 kg)   Height: 5\' 11"  (1.803 m)     PROCEDURES    ED DIAGNOSES     ICD-9-CM  ICD-10-CM   1. Sciatica of left side 724.3 M54.32        Shanon Rosser, MD 04/18/16 [phone]

## 2016-04-18 NOTE — ED Notes (Signed)
Dressed, ready to go, sitting in chair, family at [facility], vomiting at this time.

## 2016-04-18 NOTE — ED Notes (Addendum)
Nausea subsided, "feel better", out to d/c desk, given work note and Rx x1, steady gait, VSS, denies questions or needs, out with family.

## 2016-04-18 NOTE — ED Notes (Signed)
C/o L lower back pain, radiating into L buttocks and down "whole leg", onset Saturday, took ibuprofen 1600mg  at 1600, rates pain 10/10, unable to find position of comfort, h/o sciatica, "feels the same", also reports L testicle/groin pain (denies: fever, nvd, urinary sx, loss of control of bowel or bladder, flank pain, d/c or other sx), sitting on edge of bed, bracing movements guarding position.

## 2016-04-18 NOTE — ED Notes (Signed)
Rapid steady gait to room, alert, NAD, calm, interactive, no dyspnea or distress noted.

## 2016-04-18 NOTE — ED Notes (Signed)
Dr. Molpus into room, at BS. 

## 2017-01-23 ENCOUNTER — Emergency Department (HOSPITAL_BASED_OUTPATIENT_CLINIC_OR_DEPARTMENT_OTHER): Payer: BLUE CROSS/BLUE SHIELD

## 2017-01-23 ENCOUNTER — Emergency Department (HOSPITAL_BASED_OUTPATIENT_CLINIC_OR_DEPARTMENT_OTHER)
Admission: EM | Admit: 2017-01-23 | Discharge: 2017-01-23 | Disposition: A | Discharge status: Home / Self Care | Payer: BLUE CROSS/BLUE SHIELD | Attending: Emergency Medicine | Admitting: Emergency Medicine

## 2017-01-23 ENCOUNTER — Encounter (HOSPITAL_BASED_OUTPATIENT_CLINIC_OR_DEPARTMENT_OTHER): Payer: Self-pay | Admitting: *Deleted

## 2017-01-23 DIAGNOSIS — R51 Headache: Secondary | ICD-10-CM | POA: Diagnosis not present

## 2017-01-23 DIAGNOSIS — J019 Acute sinusitis, unspecified: Secondary | ICD-10-CM | POA: Insufficient documentation

## 2017-01-23 DIAGNOSIS — R42 Dizziness and giddiness: Secondary | ICD-10-CM

## 2017-01-23 DIAGNOSIS — R519 Headache, unspecified: Secondary | ICD-10-CM

## 2017-01-23 LAB — COMPREHENSIVE METABOLIC PANEL
ALT: 21 U/L (ref 17–63)
AST: 25 U/L (ref 15–41)
Albumin: 4 g/dL (ref 3.5–5.0)
Alkaline Phosphatase: 79 U/L (ref 38–126)
Anion gap: 5 (ref 5–15)
BILIRUBIN TOTAL: 0.3 mg/dL (ref 0.3–1.2)
BUN: 13 mg/dL (ref 6–20)
CO2: 27 mmol/L (ref 22–32)
CREATININE: 1.07 mg/dL (ref 0.61–1.24)
Calcium: 8.6 mg/dL — ABNORMAL LOW (ref 8.9–10.3)
Chloride: 107 mmol/L (ref 101–111)
GFR calc Af Amer: >60 mL/min (ref >60)
Glucose, Bld: 101 mg/dL — ABNORMAL HIGH (ref 65–99)
Potassium: 4.2 mmol/L (ref 3.5–5.1)
Sodium: 139 mmol/L (ref 135–145)
TOTAL PROTEIN: 6.9 g/dL (ref 6.5–8.1)

## 2017-01-23 LAB — CBC WITH DIFFERENTIAL/PLATELET
BASOS ABS: 0 10*3/uL (ref 0.0–0.1)
Basophils Relative: 0 %
EOS ABS: 0.4 10*3/uL (ref 0.0–0.7)
EOS PCT: 4 %
HCT: 40.6 % (ref 39.0–52.0)
Hemoglobin: 14 g/dL (ref 13.0–17.0)
Lymphocytes Relative: 31 %
Lymphs Abs: 2.7 10*3/uL (ref 0.7–4.0)
MCH: 29.2 pg (ref 26.0–34.0)
MCHC: 34.5 g/dL (ref 30.0–36.0)
MCV: 84.8 fL (ref 78.0–100.0)
Monocytes Absolute: 0.9 10*3/uL (ref 0.1–1.0)
Monocytes Relative: 10 %
Neutro Abs: 4.9 10*3/uL (ref 1.7–7.7)
Neutrophils Relative %: 55 %
PLATELETS: 192 10*3/uL (ref 150–400)
RBC: 4.79 MIL/uL (ref 4.22–5.81)
RDW: 14.2 % (ref 11.5–15.5)
WBC: 8.9 10*3/uL (ref 4.0–10.5)

## 2017-01-23 LAB — URINALYSIS, ROUTINE W REFLEX MICROSCOPIC
Bilirubin Urine: NEGATIVE
GLUCOSE, UA: NEGATIVE mg/dL
Hgb urine dipstick: NEGATIVE
Ketones, ur: NEGATIVE mg/dL
Leukocytes, UA: NEGATIVE
Nitrite: NEGATIVE
PH: 6.5 (ref 5.0–8.0)
Protein, ur: NEGATIVE mg/dL
Specific Gravity, Urine: 1.018 (ref 1.005–1.030)

## 2017-01-23 LAB — CK: CK TOTAL: 72 U/L (ref 49–397)

## 2017-01-23 MED ORDER — MECLIZINE HCL 25 MG PO TABS
50.0000 mg | ORAL_TABLET | Freq: Once | ORAL | Status: AC
  Administered 2017-01-23: 50 mg via ORAL
  Filled 2017-01-23: qty 2

## 2017-01-23 MED ORDER — MECLIZINE HCL 25 MG PO TABS
ORAL_TABLET | ORAL | Status: AC
  Filled 2017-01-23: qty 2

## 2017-01-23 MED ORDER — IOPAMIDOL (ISOVUE-300) INJECTION 61%
100.0000 mL | Freq: Once | INTRAVENOUS | Status: AC | PRN
  Administered 2017-01-23: 75 mL via INTRAVENOUS

## 2017-01-23 MED ORDER — MECLIZINE HCL 25 MG PO TABS: 25.0000 mg | ORAL_TABLET | Freq: Three times a day (TID) | ORAL | 0 refills | Status: DC | PRN

## 2017-01-23 MED ORDER — PREDNISONE 20 MG PO TABS: 40.0000 mg | ORAL_TABLET | Freq: Every day | ORAL | 0 refills | Status: DC

## 2017-01-23 MED ORDER — PSEUDOEPHEDRINE HCL ER 120 MG PO TB12: 120.0000 mg | ORAL_TABLET | Freq: Two times a day (BID) | ORAL | 0 refills | Status: DC

## 2017-01-23 NOTE — ED Provider Notes (Signed)
[facility] Provider Note   CSN: [id number] Arrival date & time: 01/23/17  1532  By signing my name below, I, Theresia Bough, attest that this documentation has been prepared under the direction and in the presence of Margarita Mail, PA-C.  Electronically Signed: Theresia Bough, ED Scribe. 01/23/17. 5:13 PM.  History   Chief Complaint Chief Complaint  Patient presents with  . Dizziness   The history is provided by the patient. No language interpreter was used.    HPI Comments: Don Rios is a 51 y.o. male who presents to the Emergency Department complaining of sudden onset, persistent dizziness onset yesterday. Pt has a hx of similar symptoms and was diagnosed with vertigo. Pt describes dizziness as a room-spinning sensation. Pt reports associated generalized myalgias, sinus congestion and pressure, blurry vision with quick movement, vomiting, and difficulty walking. Pt has tried Advil, hot shower and allegory medication with no relief. Pt works outside and sweats all day. Pt denies fever, cough or any other complaints at this time.   Past Medical History:  Diagnosis Date  . Chronic back pain   . Heart murmur     There are no active problems to display for this patient.   Past Surgical History:  Procedure Laterality Date  . arm surgery         Home Medications    Prior to Admission medications   Medication Sig Start Date End Date Taking? Authorizing Provider  ibuprofen (ADVIL,MOTRIN) 800 MG tablet Take 800 mg by mouth every 8 (eight) hours as needed.   Yes [provider]  meclizine (ANTIVERT) 25 MG tablet Take 1 tablet (25 mg total) by mouth 3 (three) times daily as needed for dizziness. 01/23/17   Vinson Tietze, Vernie Shanks, PA-C  predniSONE (DELTASONE) 20 MG tablet Take 2 tablets (40 mg total) by mouth daily. 01/23/17   Margarita Mail, PA-C  pseudoephedrine (SUDAFED 12 HOUR) 120 MG 12 hr tablet Take 1 tablet (120 mg total) by mouth 2 (two) times daily. 01/23/17    Margarita Mail, PA-C    Family History History reviewed. No pertinent family history.  Social History Social History  Substance Use Topics  . Smoking status: Never Smoker  . Smokeless tobacco: Never Used  . Alcohol use Yes     Comment: rare     Allergies   Patient has no known allergies.   Review of Systems Review of SystemsAll other systems reviewed and are negative for acute change except as noted in the HPI.  Physical Exam Updated Vital Signs BP 121/77 (BP Location: Left Arm)   Pulse 71   Temp 98.2 F (36.8 C) (Oral)   Resp 18   Ht 5\' 11"  (1.803 m)   Wt 90.7 kg (200 lb)   SpO2 100%   BMI 27.89 kg/m   Physical Exam  Constitutional: He is oriented to person, place, and time. He appears well-developed and well-nourished. No distress.  HENT:  Head: Normocephalic and atraumatic.  Mouth/Throat: Oropharynx is clear and moist.  Eyes: Conjunctivae and EOM are normal. Pupils are equal, round, and reactive to light. No scleral icterus.  No horizontal, vertical or rotational nystagmus  Neck: Normal range of motion. Neck supple.  Full active and passive ROM without pain No midline or paraspinal tenderness No nuchal rigidity or meningeal signs  Cardiovascular: Normal rate, regular rhythm, normal heart sounds and intact distal pulses.   Pulmonary/Chest: Effort normal and breath sounds normal. No respiratory distress. He has no wheezes. He has no rales.  Abdominal:  Soft. Bowel sounds are normal. He exhibits no distension. There is no tenderness. There is no rebound and no guarding.  Musculoskeletal: Normal range of motion.  Lymphadenopathy:    He has no cervical adenopathy.  Neurological: He is alert and oriented to person, place, and time. No cranial nerve deficit. He exhibits normal muscle tone. Coordination normal.  Mental Status:  Alert, oriented, thought content appropriate. Speech fluent without evidence of aphasia. Able to follow 2 step commands without difficulty.    Cranial Nerves:  II:  Peripheral visual fields grossly normal, pupils equal, round, reactive to light III,IV, VI: ptosis not present, extra-ocular motions intact bilaterally  V,VII: smile symmetric, facial light touch sensation equal VIII: hearing grossly normal bilaterally  IX,X: midline uvula rise  XI: bilateral shoulder shrug equal and strong XII: midline tongue extension  Motor:  5/5 in upper and lower extremities bilaterally including strong and equal grip strength and dorsiflexion/plantar flexion Sensory: Pinprick and light touch normal in all extremities.  Cerebellar: normal finger-to-nose with bilateral upper extremities Gait: MILD ATAXIA BUT ABLE TO AMBULATE CV: distal pulses palpable throughout   Skin: Skin is warm and dry. No rash noted. He is not diaphoretic.  Psychiatric: He has a normal mood and affect. His behavior is normal. Judgment and thought content normal.  Nursing note and vitals reviewed.    ED Treatments / Results  DIAGNOSTIC STUDIES: Oxygen Saturation is 99% on RA, normal by my interpretation.   COORDINATION OF CARE: 5:07 PM-Discussed next steps with pt including bloodwork. Pt verbalized understanding and is agreeable with the plan.   Labs (all labs ordered are listed, but only abnormal results are displayed) Labs Reviewed  COMPREHENSIVE METABOLIC PANEL - Abnormal; Notable for the following:       Result Value   Glucose, Bld 101 (*)    Calcium 8.6 (*)    All other components within normal limits  URINALYSIS, ROUTINE W REFLEX MICROSCOPIC  CBC WITH DIFFERENTIAL/PLATELET  CK    EKG  EKG Interpretation None       Radiology Ct Venogram Head  Result Date: 01/23/2017 CLINICAL DATA:  Headache and dizziness RIGHT 3 days. Assess for dural venous sinus thrombosis. EXAM: CT VENOGRAM HEAD CT HEAD WITHOUT CONTRAST TECHNIQUE: Contiguous axial images were obtained from the base of the skull through the vertex without intravenous contrast. Multidetector CT  imaging of the head was performed using the standard protocol during bolus administration of intravenous contrast. Multiplanar CT image reconstructions and MIPs were obtained to evaluate the vascular anatomy. CONTRAST:  20mL ISOVUE-300 IOPAMIDOL (ISOVUE-300) INJECTION 61% COMPARISON:  CT HEAD Nov 17, 2010 FINDINGS: CT HEAD BRAIN: No intraparenchymal hemorrhage, mass effect nor midline shift. The ventricles and sulci are normal. No acute large vascular territory infarcts. No abnormal extra-axial fluid collections. Basal cisterns are patent. VASCULAR: Unremarkable. SKULL/SOFT TISSUES: No skull fracture. Severe bilateral temporomandibular osteoarthrosis. No significant soft tissue swelling. ORBITS/SINUSES: The included ocular globes and orbital contents are normal.Mild paranasal sinus mucosal thickening. Mastoid air cells are well aerated. OTHER: None. CTA HEAD Normal contrast opacification of the superior sagittal sinus, torcula of the Herophili, bilateral transverse, sigmoid sinuses and included internal jugular veins. No suspicious filling defects or focal stenoses. Normal appearance of the internal cerebral veins. No abnormal intracranial enhancement. IMPRESSION: Normal noncontrast CT HEAD. Normal CT venogram head. Electronically Signed   By: Elon Alas M.D.   On: 01/23/2017 19:03    Procedures Procedures (including critical care time)  Medications Ordered in ED Medications  meclizine (ANTIVERT)  tablet 50 mg (50 mg Oral Given 01/23/17 1710)  iopamidol (ISOVUE-300) 61 % injection 100 mL (75 mLs Intravenous Contrast Given 01/23/17 1805)     Initial Impression / Assessment and Plan / ED Course  I have reviewed the triage vital signs and the nursing notes.  Pertinent labs & imaging results that were available during my care of the patient were reviewed by me and considered in my medical decision making (see chart for details).     Patient's CT scans negative for any acute abnormality including  DURAL venous sinus thrombosis. The patient is ambulatory without ataxia or vertigo after meclizine. He does have evidence of sinus mucosal thickening suggestive of acute sinusitis. This is likely viral given his sudden onset yesterday with myalgias and malaise. Patient will be treated with medications listed below. Discussed return precautions, close follow-up   Final Clinical Impressions(s) / ED Diagnoses   Final diagnoses:  Headache  Acute non-recurrent sinusitis, unspecified location  Vertigo    New Prescriptions Discharge Medication List as of 01/23/2017  7:25 PM    START taking these medications   Details  meclizine (ANTIVERT) 25 MG tablet Take 1 tablet (25 mg total) by mouth 3 (three) times daily as needed for dizziness., Starting Mon 01/23/2017, Print    predniSONE (DELTASONE) 20 MG tablet Take 2 tablets (40 mg total) by mouth daily., Starting Mon 01/23/2017, Print    pseudoephedrine (SUDAFED 12 HOUR) 120 MG 12 hr tablet Take 1 tablet (120 mg total) by mouth 2 (two) times daily., Starting Mon 01/23/2017, Print       I personally performed the services described in this documentation, which was scribed in my presence. The recorded information has been reviewed and is accurate.   Kenton Kingfisher, Vernie Shanks, PA-C 01/24/17 0106    Malvin Johns, MD 01/24/17 [phone]

## 2017-01-23 NOTE — ED Notes (Signed)
Pt verbalizes understanding of d/c instructions and denies any further needs at this time. 

## 2017-01-23 NOTE — Discharge Instructions (Addendum)
Get help right away if: °You have difficulty moving or speaking. °You are always dizzy. °You faint. °You develop severe headaches. °You have weakness in your hands, arms, or legs. °You have changes in your hearing or vision. °You develop a stiff neck. °You develop sensitivity to light. °

## 2017-01-23 NOTE — ED Triage Notes (Signed)
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

## 2019-10-17 ENCOUNTER — Emergency Department (HOSPITAL_COMMUNITY): Payer: Medicaid Other

## 2019-10-17 ENCOUNTER — Observation Stay (HOSPITAL_COMMUNITY)
Admission: EM | Admit: 2019-10-17 | Discharge: 2019-10-18 | DRG: 089 | Disposition: A | Discharge status: Home / Self Care | Payer: Medicaid Other | Attending: Surgery | Admitting: Surgery

## 2019-10-17 ENCOUNTER — Other Ambulatory Visit: Payer: Self-pay

## 2019-10-17 ENCOUNTER — Encounter (HOSPITAL_COMMUNITY): Payer: Self-pay | Admitting: Emergency Medicine

## 2019-10-17 DIAGNOSIS — T1490XA Injury, unspecified, initial encounter: Secondary | ICD-10-CM

## 2019-10-17 DIAGNOSIS — S12400A Unspecified displaced fracture of fifth cervical vertebra, initial encounter for closed fracture: Secondary | ICD-10-CM | POA: Diagnosis not present

## 2019-10-17 DIAGNOSIS — S2242XA Multiple fractures of ribs, left side, initial encounter for closed fracture: Secondary | ICD-10-CM | POA: Diagnosis present

## 2019-10-17 DIAGNOSIS — S060X1A Concussion with loss of consciousness of 30 minutes or less, initial encounter: Principal | ICD-10-CM | POA: Diagnosis present

## 2019-10-17 DIAGNOSIS — Y92410 Unspecified street and highway as the place of occurrence of the external cause: Secondary | ICD-10-CM

## 2019-10-17 DIAGNOSIS — Z20822 Contact with and (suspected) exposure to covid-19: Secondary | ICD-10-CM | POA: Diagnosis not present

## 2019-10-17 DIAGNOSIS — S2249XA Multiple fractures of ribs, unspecified side, initial encounter for closed fracture: Secondary | ICD-10-CM | POA: Diagnosis present

## 2019-10-17 DIAGNOSIS — S2241XA Multiple fractures of ribs, right side, initial encounter for closed fracture: Secondary | ICD-10-CM

## 2019-10-17 DIAGNOSIS — S129XXA Fracture of neck, unspecified, initial encounter: Secondary | ICD-10-CM

## 2019-10-17 LAB — COMPREHENSIVE METABOLIC PANEL
ALT: 24 U/L (ref 0–44)
AST: 23 U/L (ref 15–41)
Albumin: 4 g/dL (ref 3.5–5.0)
Alkaline Phosphatase: 87 U/L (ref 38–126)
Anion gap: 11 (ref 5–15)
BUN: 8 mg/dL (ref 6–20)
CO2: 23 mmol/L (ref 22–32)
Calcium: 9.2 mg/dL (ref 8.9–10.3)
Chloride: 105 mmol/L (ref 98–111)
Creatinine, Ser: 1.48 mg/dL — ABNORMAL HIGH (ref 0.61–1.24)
GFR calc Af Amer: >60 mL/min (ref >60)
GFR calc non Af Amer: 54 mL/min — ABNORMAL LOW (ref >60)
Glucose, Bld: 107 mg/dL — ABNORMAL HIGH (ref 70–99)
Potassium: 4.3 mmol/L (ref 3.5–5.1)
Sodium: 139 mmol/L (ref 135–145)
Total Bilirubin: 0.7 mg/dL (ref 0.3–1.2)
Total Protein: 7.4 g/dL (ref 6.5–8.1)

## 2019-10-17 LAB — I-STAT CHEM 8, ED
BUN: 9 mg/dL (ref 6–20)
Calcium, Ion: 1.12 mmol/L — ABNORMAL LOW (ref 1.15–1.40)
Chloride: 103 mmol/L (ref 98–111)
Creatinine, Ser: 1.3 mg/dL — ABNORMAL HIGH (ref 0.61–1.24)
Glucose, Bld: 104 mg/dL — ABNORMAL HIGH (ref 70–99)
HCT: 50 % (ref 39.0–52.0)
Hemoglobin: 17 g/dL (ref 13.0–17.0)
Potassium: 4.2 mmol/L (ref 3.5–5.1)
Sodium: 139 mmol/L (ref 135–145)
TCO2: 24 mmol/L (ref 22–32)

## 2019-10-17 LAB — URINALYSIS, ROUTINE W REFLEX MICROSCOPIC
Bacteria, UA: NONE SEEN
Bilirubin Urine: NEGATIVE
Glucose, UA: NEGATIVE mg/dL
Ketones, ur: NEGATIVE mg/dL
Leukocytes,Ua: NEGATIVE
Nitrite: NEGATIVE
Protein, ur: NEGATIVE mg/dL
Specific Gravity, Urine: 1.02 (ref 1.005–1.030)
pH: 7 (ref 5.0–8.0)

## 2019-10-17 LAB — CBC
HCT: 50.3 % (ref 39.0–52.0)
Hemoglobin: 16.3 g/dL (ref 13.0–17.0)
MCH: 28.1 pg (ref 26.0–34.0)
MCHC: 32.4 g/dL (ref 30.0–36.0)
MCV: 86.7 fL (ref 80.0–100.0)
Platelets: 246 10*3/uL (ref 150–400)
RBC: 5.8 MIL/uL (ref 4.22–5.81)
RDW: 13.8 % (ref 11.5–15.5)
WBC: 13.3 10*3/uL — ABNORMAL HIGH (ref 4.0–10.5)
nRBC: 0 % (ref 0.0–0.2)

## 2019-10-17 LAB — LACTIC ACID, PLASMA: Lactic Acid, Venous: 1.7 mmol/L (ref 0.5–1.9)

## 2019-10-17 LAB — RESPIRATORY PANEL BY RT PCR (FLU A&B, COVID)
Influenza A by PCR: NEGATIVE
Influenza B by PCR: NEGATIVE
SARS Coronavirus 2 by RT PCR: NEGATIVE

## 2019-10-17 LAB — ETHANOL: Alcohol, Ethyl (B): <10 mg/dL (ref <10)

## 2019-10-17 LAB — PROTIME-INR
INR: 1 (ref 0.8–1.2)
Prothrombin Time: 13.3 seconds (ref 11.4–15.2)

## 2019-10-17 MED ORDER — HYDROMORPHONE HCL 1 MG/ML IJ SOLN: 1.0000 mg | INTRAMUSCULAR | Status: DC | PRN

## 2019-10-17 MED ORDER — ONDANSETRON HCL 4 MG/2ML IJ SOLN: 4.0000 mg | Freq: Four times a day (QID) | INTRAMUSCULAR | Status: DC | PRN

## 2019-10-17 MED ORDER — ENOXAPARIN SODIUM 40 MG/0.4ML ~~LOC~~ SOLN: 40.0000 mg | SUBCUTANEOUS | Status: DC

## 2019-10-17 MED ORDER — ONDANSETRON 4 MG PO TBDP: 4.0000 mg | ORAL_TABLET | Freq: Four times a day (QID) | ORAL | Status: DC | PRN

## 2019-10-17 MED ORDER — IOHEXOL 300 MG/ML  SOLN
100.0000 mL | Freq: Once | INTRAMUSCULAR | Status: AC | PRN
  Administered 2019-10-17: 100 mL via INTRAVENOUS

## 2019-10-17 MED ORDER — OXYCODONE HCL 5 MG PO TABS: 5.0000 mg | ORAL_TABLET | ORAL | Status: DC | PRN

## 2019-10-17 MED ORDER — METHOCARBAMOL 1000 MG/10ML IJ SOLN
1000.0000 mg | Freq: Three times a day (TID) | INTRAVENOUS | Status: DC | PRN
  Filled 2019-10-17: qty 10

## 2019-10-17 MED ORDER — FENTANYL CITRATE (PF) 100 MCG/2ML IJ SOLN
INTRAMUSCULAR | Status: AC
  Filled 2019-10-17: qty 2

## 2019-10-17 MED ORDER — OXYCODONE HCL 5 MG PO TABS: 10.0000 mg | ORAL_TABLET | ORAL | Status: DC | PRN

## 2019-10-17 MED ORDER — METOPROLOL TARTRATE 5 MG/5ML IV SOLN: 5.0000 mg | Freq: Four times a day (QID) | INTRAVENOUS | Status: DC | PRN

## 2019-10-17 MED ORDER — ACETAMINOPHEN 325 MG PO TABS
650.0000 mg | ORAL_TABLET | Freq: Four times a day (QID) | ORAL | Status: DC
  Administered 2019-10-17 – 2019-10-18 (×3): 650 mg via ORAL
  Filled 2019-10-17 (×3): qty 2

## 2019-10-17 MED ORDER — FENTANYL CITRATE (PF) 100 MCG/2ML IJ SOLN: 50.0000 ug | Freq: Once | INTRAMUSCULAR | Status: DC

## 2019-10-17 MED ORDER — FENTANYL CITRATE (PF) 100 MCG/2ML IJ SOLN
INTRAMUSCULAR | Status: AC | PRN
  Administered 2019-10-17: 50 ug via INTRAVENOUS
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
## 2019-10-17 NOTE — ED Notes (Signed)
To CT with TRN Malachi Paradise, RN
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
## 2019-10-17 NOTE — ED Notes (Signed)
8oz of sprite provided
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
## 2019-10-17 NOTE — ED Notes (Signed)
Family updated as to patient's status. Wife Mikal Degrande [phone] - pt spoke with wife on phone

## 2019-10-18 ENCOUNTER — Inpatient Hospital Stay (HOSPITAL_COMMUNITY): Payer: Medicaid Other

## 2019-10-18 LAB — CBC
HCT: 46.2 % (ref 39.0–52.0)
Hemoglobin: 15.1 g/dL (ref 13.0–17.0)
MCH: 28.1 pg (ref 26.0–34.0)
MCHC: 32.7 g/dL (ref 30.0–36.0)
MCV: 86 fL (ref 80.0–100.0)
Platelets: 249 10*3/uL (ref 150–400)
RBC: 5.37 MIL/uL (ref 4.22–5.81)
RDW: 14.1 % (ref 11.5–15.5)
WBC: 16.5 10*3/uL — ABNORMAL HIGH (ref 4.0–10.5)
nRBC: 0 % (ref 0.0–0.2)

## 2019-10-18 LAB — SAMPLE TO BLOOD BANK

## 2019-10-18 LAB — BASIC METABOLIC PANEL
Anion gap: 12 (ref 5–15)
BUN: 10 mg/dL (ref 6–20)
CO2: 21 mmol/L — ABNORMAL LOW (ref 22–32)
Calcium: 8.9 mg/dL (ref 8.9–10.3)
Chloride: 103 mmol/L (ref 98–111)
Creatinine, Ser: 1.36 mg/dL — ABNORMAL HIGH (ref 0.61–1.24)
GFR calc Af Amer: >60 mL/min (ref >60)
GFR calc non Af Amer: 60 mL/min — ABNORMAL LOW (ref >60)
Glucose, Bld: 126 mg/dL — ABNORMAL HIGH (ref 70–99)
Potassium: 4 mmol/L (ref 3.5–5.1)
Sodium: 136 mmol/L (ref 135–145)

## 2019-10-18 LAB — HIV ANTIBODY (ROUTINE TESTING W REFLEX): HIV Screen 4th Generation wRfx: NONREACTIVE
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
## 2019-10-18 NOTE — Social Work (Signed)
CSW has spoken w/ bedside RN Steffanie Dunn for cab voucher as pt states his wife "sleeps late" and can't come get him. CSW called and spoke with pt on room phone. Pt states that he "can't get through to her," gave CSW permission to call her at [phone]. CSW called and left HIPAA compliant message on voicemail of Rayvion Klinker. CSW also texted the number requesting a call back.   Westley Hummer, MSW, [place] [facility]
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
## 2019-10-18 NOTE — Progress Notes (Signed)
Pt stated " I got in touch with my wife and she's coming to pick me up". Michiel Cowboy, CSW aware.  Belongings returned to pt from security.  AVS given and reviewed with pt. Medications discussed. All questions answered to satisfaction. Pt verbalized understanding of information given. Pt to be escorted off the unit with all belongings via wheelchair by staff member.

## 2019-10-18 NOTE — Social Work (Addendum)
CSW met with pt bedside. CSW introduced herself and explained her role. CSW completed Sbirt with pt. Pt scored a 0 on the sbirt scale. Pt denied alcohol use and other substances. Pt did not need resources.  Emeterio Reeve, Latanya Presser, [place] Licensed Clinical Social Worker [phone]
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
## 2019-10-18 NOTE — Plan of Care (Signed)
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
## 2019-10-21 ENCOUNTER — Encounter (HOSPITAL_BASED_OUTPATIENT_CLINIC_OR_DEPARTMENT_OTHER): Payer: Self-pay | Admitting: *Deleted

## 2019-11-11 ENCOUNTER — Other Ambulatory Visit (HOSPITAL_COMMUNITY): Payer: Self-pay | Admitting: Neurological Surgery

## 2019-11-11 ENCOUNTER — Other Ambulatory Visit: Payer: Self-pay | Admitting: Neurological Surgery

## 2019-11-11 DIAGNOSIS — S12591G Other nondisplaced fracture of sixth cervical vertebra, subsequent encounter for fracture with delayed healing: Secondary | ICD-10-CM

## 2019-11-12 ENCOUNTER — Other Ambulatory Visit (HOSPITAL_COMMUNITY): Payer: Self-pay | Admitting: Neurological Surgery

## 2019-11-12 DIAGNOSIS — S12591G Other nondisplaced fracture of sixth cervical vertebra, subsequent encounter for fracture with delayed healing: Secondary | ICD-10-CM

## 2019-11-23 ENCOUNTER — Other Ambulatory Visit: Payer: Self-pay

## 2019-11-23 ENCOUNTER — Ambulatory Visit (HOSPITAL_COMMUNITY)
Admission: RE | Admit: 2019-11-23 | Discharge: 2019-11-23 | Disposition: A | Discharge status: Home / Self Care | Payer: Medicaid Other | Source: Ambulatory Visit | Attending: Neurological Surgery | Admitting: Neurological Surgery

## 2019-11-23 DIAGNOSIS — S12591G Other nondisplaced fracture of sixth cervical vertebra, subsequent encounter for fracture with delayed healing: Secondary | ICD-10-CM | POA: Diagnosis not present

## 2020-04-20 ENCOUNTER — Other Ambulatory Visit: Payer: Self-pay | Admitting: Neurological Surgery

## 2020-05-07 ENCOUNTER — Encounter (HOSPITAL_COMMUNITY): Payer: Self-pay | Admitting: Neurological Surgery

## 2020-05-07 ENCOUNTER — Other Ambulatory Visit: Payer: Self-pay

## 2020-05-07 NOTE — Progress Notes (Signed)
Spoke with pt for pre-op call. Pt states he has a heart murmur but it's never given him any problems. Denies any other cardiac hx, HTN or Diabetes.  Covid test to be done day of surgery due to pt having transportation issues.

## 2020-05-10 NOTE — Anesthesia Preprocedure Evaluation (Addendum)
Anesthesia Evaluation  Patient identified by MRN, date of birth, ID band Patient awake    Reviewed: Allergy & Precautions, NPO status , Patient's Chart, lab work & pertinent test results  Airway Mallampati: III  TM Distance: <3 FB Neck ROM: Limited    Dental  (+) Dental Advisory Given, Poor Dentition, Missing   Pulmonary neg pulmonary ROS,    breath sounds clear to auscultation       Cardiovascular negative cardio ROS   Rhythm:Regular Rate:Normal     Neuro/Psych Cervical radiculopathy    GI/Hepatic Neg liver ROS, GERD  ,  Endo/Other  negative endocrine ROS  Renal/GU negative Renal ROS     Musculoskeletal  (+) Arthritis ,   Abdominal   Peds  Hematology negative hematology ROS (+)   Anesthesia Other Findings   Reproductive/Obstetrics                            Lab Results  Component Value Date   WBC 16.5 (H) 10/18/2019   HGB 15.1 10/18/2019   HCT 46.2 10/18/2019   MCV 86.0 10/18/2019   PLT 249 10/18/2019   Lab Results  Component Value Date   CREATININE 1.36 (H) 10/18/2019   BUN 10 10/18/2019   NA 136 10/18/2019   K 4.0 10/18/2019   CL 103 10/18/2019   CO2 21 (L) 10/18/2019    Anesthesia Physical Anesthesia Plan  ASA: II  Anesthesia Plan: General   Post-op Pain Management:    Induction: Intravenous  PONV Risk Score and Plan: 2 and Dexamethasone, Treatment may vary due to age or medical condition and Ondansetron  Airway Management Planned: Oral ETT and Video Laryngoscope Planned  Additional Equipment: None  Intra-op Plan:   Post-operative Plan: Extubation in OR  Informed Consent: I have reviewed the patients History and Physical, chart, labs and discussed the procedure including the risks, benefits and alternatives for the proposed anesthesia with the patient or authorized representative who has indicated his/her understanding and acceptance.     Dental advisory  given  Plan Discussed with: CRNA  Anesthesia Plan Comments:       Anesthesia Quick Evaluation

## 2020-05-11 ENCOUNTER — Observation Stay (HOSPITAL_COMMUNITY)
Admission: RE | Admit: 2020-05-11 | Discharge: 2020-05-11 | Disposition: A | Discharge status: Home / Self Care | Payer: Medicaid Other | Attending: Neurological Surgery | Admitting: Neurological Surgery

## 2020-05-11 ENCOUNTER — Other Ambulatory Visit: Payer: Self-pay

## 2020-05-11 ENCOUNTER — Ambulatory Visit (HOSPITAL_COMMUNITY): Payer: Medicaid Other | Admitting: Anesthesiology

## 2020-05-11 ENCOUNTER — Encounter (HOSPITAL_COMMUNITY): Payer: Self-pay | Admitting: Neurological Surgery

## 2020-05-11 ENCOUNTER — Encounter (HOSPITAL_COMMUNITY)
Admission: RE | Disposition: A | Discharge status: Home / Self Care | Payer: Self-pay | Source: Home / Self Care | Attending: Neurological Surgery

## 2020-05-11 ENCOUNTER — Ambulatory Visit (HOSPITAL_COMMUNITY): Payer: Medicaid Other

## 2020-05-11 DIAGNOSIS — M5412 Radiculopathy, cervical region: Secondary | ICD-10-CM | POA: Diagnosis not present

## 2020-05-11 DIAGNOSIS — M4712 Other spondylosis with myelopathy, cervical region: Principal | ICD-10-CM | POA: Diagnosis present

## 2020-05-11 DIAGNOSIS — Z20822 Contact with and (suspected) exposure to covid-19: Secondary | ICD-10-CM | POA: Insufficient documentation

## 2020-05-11 DIAGNOSIS — G9520 Unspecified cord compression: Secondary | ICD-10-CM | POA: Diagnosis not present

## 2020-05-11 DIAGNOSIS — M542 Cervicalgia: Secondary | ICD-10-CM | POA: Diagnosis present

## 2020-05-11 DIAGNOSIS — Z419 Encounter for procedure for purposes other than remedying health state, unspecified: Secondary | ICD-10-CM

## 2020-05-11 DIAGNOSIS — Z8781 Personal history of (healed) traumatic fracture: Secondary | ICD-10-CM | POA: Insufficient documentation

## 2020-05-11 DIAGNOSIS — M4722 Other spondylosis with radiculopathy, cervical region: Secondary | ICD-10-CM | POA: Diagnosis not present

## 2020-05-11 HISTORY — PX: ANTERIOR CERVICAL DECOMP/DISCECTOMY FUSION: SHX1161

## 2020-05-11 HISTORY — DX: Pneumonia, unspecified organism: J18.9

## 2020-05-11 HISTORY — DX: Unspecified osteoarthritis, unspecified site: M19.90

## 2020-05-11 HISTORY — DX: Headache, unspecified: R51.9

## 2020-05-11 HISTORY — DX: Gastro-esophageal reflux disease without esophagitis: K21.9

## 2020-05-11 LAB — CBC
HCT: 45.2 % (ref 39.0–52.0)
Hemoglobin: 14.6 g/dL (ref 13.0–17.0)
MCH: 28.5 pg (ref 26.0–34.0)
MCHC: 32.3 g/dL (ref 30.0–36.0)
MCV: 88.3 fL (ref 80.0–100.0)
Platelets: 220 10*3/uL (ref 150–400)
RBC: 5.12 MIL/uL (ref 4.22–5.81)
RDW: 13.7 % (ref 11.5–15.5)
WBC: 10.8 10*3/uL — ABNORMAL HIGH (ref 4.0–10.5)
nRBC: 0 % (ref 0.0–0.2)

## 2020-05-11 LAB — SURGICAL PCR SCREEN
MRSA, PCR: NEGATIVE
Staphylococcus aureus: NEGATIVE

## 2020-05-11 LAB — ABO/RH: ABO/RH(D): A POS

## 2020-05-11 LAB — TYPE AND SCREEN
ABO/RH(D): A POS
Antibody Screen: NEGATIVE

## 2020-05-11 LAB — SARS CORONAVIRUS 2 BY RT PCR (HOSPITAL ORDER, PERFORMED IN ~~LOC~~ HOSPITAL LAB): SARS Coronavirus 2: NEGATIVE

## 2020-05-11 SURGERY — ANTERIOR CERVICAL DECOMPRESSION/DISCECTOMY FUSION 3 LEVELS
Anesthesia: General | Site: Neck

## 2020-05-11 MED ORDER — BISACODYL 10 MG RE SUPP: 10.0000 mg | Freq: Every day | RECTAL | Status: DC | PRN

## 2020-05-11 MED ORDER — 0.9 % SODIUM CHLORIDE (POUR BTL) OPTIME
TOPICAL | Status: DC | PRN
  Administered 2020-05-11: 1000 mL

## 2020-05-11 MED ORDER — PROPOFOL 10 MG/ML IV BOLUS
INTRAVENOUS | Status: AC
  Filled 2020-05-11: qty 20

## 2020-05-11 MED ORDER — ACETAMINOPHEN 650 MG RE SUPP: 650.0000 mg | RECTAL | Status: DC | PRN

## 2020-05-11 MED ORDER — FENTANYL CITRATE (PF) 250 MCG/5ML IJ SOLN
INTRAMUSCULAR | Status: DC | PRN
  Administered 2020-05-11 (×6): 50 ug via INTRAVENOUS

## 2020-05-11 MED ORDER — SODIUM CHLORIDE 0.9% FLUSH: 3.0000 mL | INTRAVENOUS | Status: DC | PRN

## 2020-05-11 MED ORDER — THROMBIN 5000 UNITS EX SOLR
CUTANEOUS | Status: AC
  Filled 2020-05-11: qty 5000

## 2020-05-11 MED ORDER — OXYCODONE-ACETAMINOPHEN 5-325 MG PO TABS: 1.0000 | ORAL_TABLET | ORAL | Status: DC | PRN

## 2020-05-11 MED ORDER — ROCURONIUM BROMIDE 10 MG/ML (PF) SYRINGE
PREFILLED_SYRINGE | INTRAVENOUS | Status: AC
  Filled 2020-05-11: qty 10

## 2020-05-11 MED ORDER — ACETAMINOPHEN 500 MG PO TABS: 500.0000 mg | ORAL_TABLET | Freq: Four times a day (QID) | ORAL | Status: DC | PRN

## 2020-05-11 MED ORDER — ROCURONIUM BROMIDE 10 MG/ML (PF) SYRINGE
PREFILLED_SYRINGE | INTRAVENOUS | Status: DC | PRN
  Administered 2020-05-11: 30 mg via INTRAVENOUS
  Administered 2020-05-11: 10 mg via INTRAVENOUS
  Administered 2020-05-11: 50 mg via INTRAVENOUS
  Administered 2020-05-11 (×2): 20 mg via INTRAVENOUS

## 2020-05-11 MED ORDER — SUGAMMADEX SODIUM 200 MG/2ML IV SOLN
INTRAVENOUS | Status: DC | PRN
  Administered 2020-05-11: 250 mg via INTRAVENOUS

## 2020-05-11 MED ORDER — DOCUSATE SODIUM 100 MG PO CAPS: 100.0000 mg | ORAL_CAPSULE | Freq: Two times a day (BID) | ORAL | Status: DC

## 2020-05-11 MED ORDER — CHLORHEXIDINE GLUCONATE 0.12 % MT SOLN
15.0000 mL | Freq: Once | OROMUCOSAL | Status: AC
  Administered 2020-05-11: 15 mL via OROMUCOSAL
  Filled 2020-05-11: qty 15

## 2020-05-11 MED ORDER — THROMBIN 5000 UNITS EX SOLR
OROMUCOSAL | Status: DC | PRN
  Administered 2020-05-11: 5 mL via TOPICAL

## 2020-05-11 MED ORDER — CEFAZOLIN SODIUM-DEXTROSE 2-4 GM/100ML-% IV SOLN
2.0000 g | INTRAVENOUS | Status: AC
  Administered 2020-05-11: 2 g via INTRAVENOUS
  Filled 2020-05-11: qty 100

## 2020-05-11 MED ORDER — PHENYLEPHRINE HCL-NACL 10-0.9 MG/250ML-% IV SOLN
INTRAVENOUS | Status: DC | PRN
  Administered 2020-05-11: 25 ug/min via INTRAVENOUS

## 2020-05-11 MED ORDER — HYDROXYZINE HCL 50 MG/ML IM SOLN
50.0000 mg | Freq: Four times a day (QID) | INTRAMUSCULAR | Status: DC | PRN
  Administered 2020-05-11: 50 mg via INTRAMUSCULAR
  Filled 2020-05-11: qty 1

## 2020-05-11 MED ORDER — METHOCARBAMOL 500 MG PO TABS
ORAL_TABLET | ORAL | Status: AC
  Filled 2020-05-11: qty 1

## 2020-05-11 MED ORDER — ONDANSETRON HCL 4 MG/2ML IJ SOLN
INTRAMUSCULAR | Status: AC
  Filled 2020-05-11: qty 2

## 2020-05-11 MED ORDER — CHLORHEXIDINE GLUCONATE CLOTH 2 % EX PADS: 6.0000 | MEDICATED_PAD | Freq: Once | CUTANEOUS | Status: DC

## 2020-05-11 MED ORDER — SODIUM CHLORIDE 0.9 % IV SOLN: 250.0000 mL | INTRAVENOUS | Status: DC

## 2020-05-11 MED ORDER — ACETAMINOPHEN 325 MG PO TABS: 650.0000 mg | ORAL_TABLET | ORAL | Status: DC | PRN

## 2020-05-11 MED ORDER — AMISULPRIDE (ANTIEMETIC) 5 MG/2ML IV SOLN
10.0000 mg | Freq: Once | INTRAVENOUS | Status: AC | PRN
  Administered 2020-05-11: 10 mg via INTRAVENOUS

## 2020-05-11 MED ORDER — METHOCARBAMOL 500 MG PO TABS
500.0000 mg | ORAL_TABLET | Freq: Four times a day (QID) | ORAL | Status: DC | PRN
  Administered 2020-05-11: 500 mg via ORAL

## 2020-05-11 MED ORDER — POLYETHYLENE GLYCOL 3350 17 G PO PACK: 17.0000 g | PACK | Freq: Every day | ORAL | Status: DC | PRN

## 2020-05-11 MED ORDER — LIDOCAINE-EPINEPHRINE 1 %-1:100000 IJ SOLN
INTRAMUSCULAR | Status: DC | PRN
  Administered 2020-05-11: 3.5 mL

## 2020-05-11 MED ORDER — FENTANYL CITRATE (PF) 100 MCG/2ML IJ SOLN: 25.0000 ug | INTRAMUSCULAR | Status: DC | PRN

## 2020-05-11 MED ORDER — DIAZEPAM 5 MG PO TABS: 5.0000 mg | ORAL_TABLET | Freq: Four times a day (QID) | ORAL | 0 refills | Status: DC | PRN

## 2020-05-11 MED ORDER — ONDANSETRON HCL 4 MG/2ML IJ SOLN: 4.0000 mg | Freq: Four times a day (QID) | INTRAMUSCULAR | Status: DC | PRN

## 2020-05-11 MED ORDER — LIDOCAINE 2% (20 MG/ML) 5 ML SYRINGE
INTRAMUSCULAR | Status: DC | PRN
  Administered 2020-05-11: 40 mg via INTRAVENOUS

## 2020-05-11 MED ORDER — ONDANSETRON HCL 4 MG PO TABS: 4.0000 mg | ORAL_TABLET | Freq: Four times a day (QID) | ORAL | Status: DC | PRN

## 2020-05-11 MED ORDER — ORAL CARE MOUTH RINSE: 15.0000 mL | Freq: Once | OROMUCOSAL | Status: AC

## 2020-05-11 MED ORDER — OXYCODONE-ACETAMINOPHEN 5-325 MG PO TABS
1.0000 | ORAL_TABLET | Freq: Four times a day (QID) | ORAL | 0 refills | Status: DC | PRN
Start: 2020-05-11 — End: 2021-03-11

## 2020-05-11 MED ORDER — BUPIVACAINE HCL (PF) 0.5 % IJ SOLN
INTRAMUSCULAR | Status: AC
  Filled 2020-05-11: qty 30

## 2020-05-11 MED ORDER — METHOCARBAMOL 1000 MG/10ML IJ SOLN
500.0000 mg | Freq: Four times a day (QID) | INTRAVENOUS | Status: DC | PRN
  Filled 2020-05-11: qty 5

## 2020-05-11 MED ORDER — FENTANYL CITRATE (PF) 250 MCG/5ML IJ SOLN
INTRAMUSCULAR | Status: AC
  Filled 2020-05-11: qty 5

## 2020-05-11 MED ORDER — SENNA 8.6 MG PO TABS: 1.0000 | ORAL_TABLET | Freq: Two times a day (BID) | ORAL | Status: DC

## 2020-05-11 MED ORDER — MORPHINE SULFATE (PF) 2 MG/ML IV SOLN: 2.0000 mg | INTRAVENOUS | Status: DC | PRN

## 2020-05-11 MED ORDER — LIDOCAINE 2% (20 MG/ML) 5 ML SYRINGE
INTRAMUSCULAR | Status: AC
  Filled 2020-05-11: qty 5

## 2020-05-11 MED ORDER — BUPIVACAINE HCL (PF) 0.5 % IJ SOLN
INTRAMUSCULAR | Status: DC | PRN
  Administered 2020-05-11: 3.5 mL

## 2020-05-11 MED ORDER — DEXAMETHASONE SODIUM PHOSPHATE 10 MG/ML IJ SOLN
INTRAMUSCULAR | Status: DC | PRN
  Administered 2020-05-11: 10 mg via INTRAVENOUS

## 2020-05-11 MED ORDER — MIDAZOLAM HCL 5 MG/5ML IJ SOLN
INTRAMUSCULAR | Status: DC | PRN
  Administered 2020-05-11: 2 mg via INTRAVENOUS

## 2020-05-11 MED ORDER — MENTHOL 3 MG MT LOZG: 1.0000 | LOZENGE | OROMUCOSAL | Status: DC | PRN

## 2020-05-11 MED ORDER — PHENOL 1.4 % MT LIQD: 1.0000 | OROMUCOSAL | Status: DC | PRN

## 2020-05-11 MED ORDER — DEXAMETHASONE SODIUM PHOSPHATE 10 MG/ML IJ SOLN
INTRAMUSCULAR | Status: AC
  Filled 2020-05-11: qty 1

## 2020-05-11 MED ORDER — MIDAZOLAM HCL 2 MG/2ML IJ SOLN
INTRAMUSCULAR | Status: AC
  Filled 2020-05-11: qty 2

## 2020-05-11 MED ORDER — AMISULPRIDE (ANTIEMETIC) 5 MG/2ML IV SOLN
INTRAVENOUS | Status: AC
  Filled 2020-05-11: qty 4

## 2020-05-11 MED ORDER — ONDANSETRON HCL 4 MG/2ML IJ SOLN
INTRAMUSCULAR | Status: DC | PRN
  Administered 2020-05-11: 4 mg via INTRAVENOUS

## 2020-05-11 MED ORDER — LIDOCAINE-EPINEPHRINE 1 %-1:100000 IJ SOLN
INTRAMUSCULAR | Status: AC
  Filled 2020-05-11: qty 1

## 2020-05-11 MED ORDER — LACTATED RINGERS IV SOLN: INTRAVENOUS | Status: DC

## 2020-05-11 MED ORDER — ROCURONIUM BROMIDE 10 MG/ML (PF) SYRINGE
PREFILLED_SYRINGE | INTRAVENOUS | Status: AC
  Filled 2020-05-11: qty 20

## 2020-05-11 MED ORDER — FLEET ENEMA 7-19 GM/118ML RE ENEM: 1.0000 | ENEMA | Freq: Once | RECTAL | Status: DC | PRN

## 2020-05-11 MED ORDER — ACETAMINOPHEN 500 MG PO TABS
1000.0000 mg | ORAL_TABLET | Freq: Once | ORAL | Status: AC
  Administered 2020-05-11: 1000 mg via ORAL
  Filled 2020-05-11: qty 2

## 2020-05-11 MED ORDER — SODIUM CHLORIDE 0.9% FLUSH: 3.0000 mL | Freq: Two times a day (BID) | INTRAVENOUS | Status: DC

## 2020-05-11 MED ORDER — ALUM & MAG HYDROXIDE-SIMETH 200-200-20 MG/5ML PO SUSP: 30.0000 mL | Freq: Four times a day (QID) | ORAL | Status: DC | PRN

## 2020-05-11 MED ORDER — LACTATED RINGERS IV SOLN: INTRAVENOUS | Status: DC | PRN

## 2020-05-11 MED ORDER — PROPOFOL 10 MG/ML IV BOLUS
INTRAVENOUS | Status: DC | PRN
  Administered 2020-05-11: 200 mg via INTRAVENOUS

## 2020-05-11 SURGICAL SUPPLY — 60 items
BAND RUBBER #18 3X1/16 STRL (MISCELLANEOUS) IMPLANT
BIT DRILL ACP 11 (DRILL) ×1 IMPLANT
BIT DRILL NEURO 2X3.1 SFT TUCH (MISCELLANEOUS) ×1 IMPLANT
BNDG GAUZE ELAST 4 BULKY (GAUZE/BANDAGES/DRESSINGS) IMPLANT
BUR BARREL STRAIGHT FLUTE 4.0 (BURR) IMPLANT
CABLE BIPOLOR RESECTION CORD (MISCELLANEOUS) ×3 IMPLANT
CAGE CERV MOD 6X17X14 7D (Cage) ×9 IMPLANT
CANISTER SUCT 3000ML PPV (MISCELLANEOUS) ×3 IMPLANT
DECANTER SPIKE VIAL GLASS SM (MISCELLANEOUS) ×3 IMPLANT
DERMABOND ADVANCED (GAUZE/BANDAGES/DRESSINGS) ×2
DERMABOND ADVANCED .7 DNX12 (GAUZE/BANDAGES/DRESSINGS) ×1 IMPLANT
DRAPE BACK TABLE (DRAPES) ×3 IMPLANT
DRAPE HALF SHEET 40X57 (DRAPES) ×3 IMPLANT
DRAPE LAPAROTOMY 100X72 PEDS (DRAPES) ×3 IMPLANT
DRAPE MICROSCOPE LEICA (MISCELLANEOUS) IMPLANT
DRILL ACP 11 (DRILL) ×3
DRILL NEURO 2X3.1 SOFT TOUCH (MISCELLANEOUS) ×3
DURAPREP 6ML APPLICATOR 50/CS (WOUND CARE) ×3 IMPLANT
ELECT REM PT RETURN 9FT ADLT (ELECTROSURGICAL) ×3
ELECTRODE REM PT RTRN 9FT ADLT (ELECTROSURGICAL) ×1 IMPLANT
GAUZE 4X4 16PLY RFD (DISPOSABLE) IMPLANT
GLOVE BIO SURGEON STRL SZ 6.5 (GLOVE) ×2 IMPLANT
GLOVE BIO SURGEONS STRL SZ 6.5 (GLOVE) ×1
GLOVE BIOGEL PI IND STRL 6.5 (GLOVE) ×2 IMPLANT
GLOVE BIOGEL PI IND STRL 7.5 (GLOVE) ×1 IMPLANT
GLOVE BIOGEL PI IND STRL 8.5 (GLOVE) ×1 IMPLANT
GLOVE BIOGEL PI INDICATOR 6.5 (GLOVE) ×4
GLOVE BIOGEL PI INDICATOR 7.5 (GLOVE) ×2
GLOVE BIOGEL PI INDICATOR 8.5 (GLOVE) ×2
GLOVE ECLIPSE 8.5 STRL (GLOVE) ×3 IMPLANT
GLOVE EXAM NITRILE XL STR (GLOVE) IMPLANT
GLOVE SS BIOGEL STRL SZ 7 (GLOVE) ×1 IMPLANT
GLOVE SUPERSENSE BIOGEL SZ 7 (GLOVE) ×2
GOWN STRL REUS W/ TWL LRG LVL3 (GOWN DISPOSABLE) IMPLANT
GOWN STRL REUS W/ TWL XL LVL3 (GOWN DISPOSABLE) ×1 IMPLANT
GOWN STRL REUS W/TWL 2XL LVL3 (GOWN DISPOSABLE) ×3 IMPLANT
GOWN STRL REUS W/TWL LRG LVL3 (GOWN DISPOSABLE)
GOWN STRL REUS W/TWL XL LVL3 (GOWN DISPOSABLE) ×2
HALTER HD/CHIN CERV TRACTION D (MISCELLANEOUS) ×3 IMPLANT
HEMOSTAT POWDER KIT SURGIFOAM (HEMOSTASIS) ×3 IMPLANT
KIT BASIN OR (CUSTOM PROCEDURE TRAY) ×3 IMPLANT
KIT TURNOVER KIT B (KITS) ×3 IMPLANT
NEEDLE HYPO 22GX1.5 SAFETY (NEEDLE) ×3 IMPLANT
NEEDLE SPNL 22GX3.5 QUINCKE BK (NEEDLE) ×3 IMPLANT
NS IRRIG 1000ML POUR BTL (IV SOLUTION) ×3 IMPLANT
PACK LAMINECTOMY NEURO (CUSTOM PROCEDURE TRAY) ×3 IMPLANT
PAD ARMBOARD 7.5X6 YLW CONV (MISCELLANEOUS) ×9 IMPLANT
PATTIES SURGICAL .5 X1 (DISPOSABLE) ×3 IMPLANT
PIN DISTRACTION 14MM (PIN) IMPLANT
PLATE ACP 1.9X56 3LVL (Screw) ×3 IMPLANT
PUTTY DBX 2.5CC (Putty) ×3 IMPLANT
PUTTY DBX 2.5CC DEPUY (Putty) ×1 IMPLANT
SCREW ACP VA ST 3.5X15 (Screw) ×24 IMPLANT
SET WALTER ACTIVATION W/DRAPE (SET/KITS/TRAYS/PACK) ×3 IMPLANT
SPONGE INTESTINAL PEANUT (DISPOSABLE) ×3 IMPLANT
SUT VIC AB 4-0 RB1 18 (SUTURE) ×6 IMPLANT
TOWEL GREEN STERILE (TOWEL DISPOSABLE) ×3 IMPLANT
TOWEL GREEN STERILE FF (TOWEL DISPOSABLE) ×3 IMPLANT
TRAY FOLEY MTR SLVR 16FR STAT (SET/KITS/TRAYS/PACK) ×3 IMPLANT
WATER STERILE IRR 1000ML POUR (IV SOLUTION) ×3 IMPLANT
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
## 2020-05-11 NOTE — Progress Notes (Signed)
Orthopedic Tech Progress Note Patient Details:  Don Rios 09/13/1965 [id number] Applied in PACU  Ortho Devices Type of Ortho Device: Soft collar Ortho Device/Splint Location: NECK Ortho Device/Splint Interventions: Ordered, Application, Adjustment   Post Interventions Patient Tolerated: Well Instructions Provided: Care of device   [name] 05/11/2020, 1:14 PM

## 2020-05-11 NOTE — Anesthesia Procedure Notes (Signed)
Procedure Name: Intubation Date/Time: 05/11/2020 8:08 AM Performed by: Gaylene Brooks, CRNA Pre-anesthesia Checklist: Patient identified, Emergency Drugs available, Suction available and Patient being monitored Patient Re-evaluated:Patient Re-evaluated prior to induction Oxygen Delivery Method: Circle System Utilized Preoxygenation: Pre-oxygenation with 100% oxygen Induction Type: IV induction Ventilation: Mask ventilation without difficulty Laryngoscope Size: Glidescope and 3 Grade View: Grade I Tube type: Oral Tube size: 7.5 mm Number of attempts: 1 Airway Equipment and Method: Stylet and Video-laryngoscopy Placement Confirmation: ETT inserted through vocal cords under direct vision,  positive ETCO2 and breath sounds checked- equal and bilateral Secured at: 22 cm Tube secured with: Tape Dental Injury: Teeth and Oropharynx as per pre-operative assessment  Comments: Pt with short TM distance, prominent front teeth with overbite, and 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 Decision made to use glidescope. No neck extension during intubation. AOI with glide. +ETCO2, BBS=

## 2020-05-11 NOTE — Transfer of Care (Signed)
Immediate Anesthesia Transfer of Care Note  Patient: Eusevio Schriver  Procedure(s) Performed: Cervical four-five Cervical five-six Cervical six-seven Anterior cervical decompression/discectomy/fusion (N/A Neck)  Patient Location: PACU  Anesthesia Type:General  Level of Consciousness: drowsy and patient cooperative  Airway & Oxygen Therapy: Patient Spontanous Breathing and Patient connected to nasal cannula oxygen  Post-op Assessment: Report given to RN and Post -op Vital signs reviewed and stable  Post vital signs: Reviewed and stable  Last Vitals:  Vitals Value Taken Time  BP 119/71 05/11/20 1144  Temp    Pulse 110 05/11/20 1145  Resp 15 05/11/20 1144  SpO2 95 % 05/11/20 1145  Vitals shown include unvalidated device data.  Last Pain:  Vitals:   05/11/20 0640  TempSrc:   PainSc: 0-No pain         Complications: No complications documented.

## 2020-05-11 NOTE — Discharge Summary (Signed)
Physician Discharge Summary  Patient ID: Don Rios MRN: [id number] DOB/AGE: [birth year] 54 y.o.  Admit date: 05/11/2020 Discharge date: 05/11/2020  Admission Diagnoses: Cervical spondylosis with myelopathy and radiculopathy C4-5 C5-6 C6-C7  Discharge Diagnoses: Cervical spondylosis with myelopathy and radiculopathy C4-5 C5-6 C6-C7.  History of C6 fracture April 2021. Active Problems:   Cervical spondylosis with myelopathy   Discharged Condition: good  Hospital Course: Patient was admitted to undergo surgical decompression arthrodesis at C4-5 C5-6 and C6-C7.  He tolerated surgery well.  Consults: None  Significant Diagnostic Studies: None  Treatments: surgery: Anterior cervical decompression C4-5 C5-6 C6-C7 arthrodesis was with allograft and titanium spacer fixation with titanium plate and screws.  Discharge Exam: Blood pressure (!) 148/77, pulse 69, temperature 97.6 F (36.4 C), resp. rate 16, height 5\' 11"  (1.803 m), weight 108.9 kg, SpO2 94 %. Incision is clean and dry motor function is intact Station and gait are intact.  Disposition: Discharge disposition: 01-Home or Self Care       Discharge Instructions    Call MD for:  redness, tenderness, or signs of infection (pain, swelling, redness, odor or green/yellow discharge around incision site)   Complete by: As directed    Call MD for:  severe uncontrolled pain   Complete by: As directed    Call MD for:  temperature >100.4   Complete by: As directed    Diet - low sodium heart healthy   Complete by: As directed    Diet general   Complete by: As directed    Discharge wound care:   Complete by: As directed    Okay to shower. Do not apply salves or appointments to incision. No heavy lifting with the upper extremities greater than 15 pounds. May resume driving when not requiring pain medication and patient feels comfortable with doing so.   Incentive spirometry RT   Complete by: As directed    Increase activity slowly    Complete by: As directed      Allergies as of 05/11/2020   No Known Allergies     Medication List    TAKE these medications   acetaminophen 500 MG tablet Commonly known as: TYLENOL Take 500-1,000 mg by mouth every 6 (six) hours as needed for mild pain or headache.   diazepam 5 MG tablet Commonly known as: Valium Take 1 tablet (5 mg total) by mouth every 6 (six) hours as needed for muscle spasms.   oxyCODONE-acetaminophen 5-325 MG tablet Commonly known as: PERCOCET/ROXICET Take 1-2 tablets by mouth every 6 (six) hours as needed for moderate pain or severe pain.            Discharge Care Instructions  (From admission, onward)         Start     Ordered   05/11/20 0000  Discharge wound care:       Comments: Okay to shower. Do not apply salves or appointments to incision. No heavy lifting with the upper extremities greater than 15 pounds. May resume driving when not requiring pain medication and patient feels comfortable with doing so.   05/11/20 1810           Signed: Blanchie Dessert Deanette Tullius 05/11/2020, 6:10 PM

## 2020-05-11 NOTE — Plan of Care (Signed)
Pt doing well. Pt and wife given D/C instructions with verbal understanding. Rx's were sent to the Pt's pharmacy by MD. Pt's incision is clean and dry with no sign of infection. Pt's IV was removed prior to D/C. Pt D/C'd home via wheelchair per MD order. Pt is stable @ D/C and has no other needs at this time. Holli Humbles, RN

## 2020-05-11 NOTE — Anesthesia Postprocedure Evaluation (Signed)
Anesthesia Post Note  Patient: Don Rios  Procedure(s) Performed: Cervical four-five Cervical five-six Cervical six-seven Anterior cervical decompression/discectomy/fusion (N/A Neck)     Patient location during evaluation: PACU Anesthesia Type: General Level of consciousness: awake and alert Pain management: pain level controlled Vital Signs Assessment: post-procedure vital signs reviewed and stable Respiratory status: spontaneous breathing, nonlabored ventilation, respiratory function stable and patient connected to nasal cannula oxygen Cardiovascular status: blood pressure returned to baseline and stable Postop Assessment: no apparent nausea or vomiting Anesthetic complications: no   No complications documented.  Last Vitals:  Vitals:   05/11/20 1326 05/11/20 1548  BP: (!) 143/85 (!) 148/77  Pulse: 80 69  Resp: 18 16  Temp:  36.4 C  SpO2: 94% 94%    Last Pain:  Vitals:   05/11/20 1330  TempSrc:   PainSc: 0-No pain                 Tiajuana Amass
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
## 2020-05-12 ENCOUNTER — Encounter (HOSPITAL_COMMUNITY): Payer: Self-pay | Admitting: Neurological Surgery

## 2020-07-15 ENCOUNTER — Ambulatory Visit (HOSPITAL_COMMUNITY): Payer: Medicaid Other | Attending: Neurological Surgery | Admitting: Physical Therapy

## 2020-07-15 ENCOUNTER — Other Ambulatory Visit: Payer: Self-pay

## 2020-07-15 ENCOUNTER — Encounter (HOSPITAL_COMMUNITY): Payer: Self-pay | Admitting: Physical Therapy

## 2020-07-15 DIAGNOSIS — M542 Cervicalgia: Secondary | ICD-10-CM | POA: Diagnosis present

## 2020-07-15 DIAGNOSIS — R2689 Other abnormalities of gait and mobility: Secondary | ICD-10-CM | POA: Insufficient documentation
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
## 2020-07-21 ENCOUNTER — Encounter (HOSPITAL_COMMUNITY): Payer: Self-pay

## 2020-07-21 ENCOUNTER — Ambulatory Visit (HOSPITAL_COMMUNITY): Payer: Medicaid Other | Admitting: Physical Therapy

## 2020-07-21 ENCOUNTER — Telehealth (HOSPITAL_COMMUNITY): Payer: Self-pay | Admitting: Physical Therapy

## 2020-07-21 NOTE — Telephone Encounter (Signed)
Pt called regarding missed appointment and stated he cancelled via phone tree call.  REminded of next appt on Thursday as well as change in appointment time (therapist will not be here at original time).  Pt reported he would be here for appointment.   Lurena Nida, PTA/CLT [phone]

## 2020-07-23 ENCOUNTER — Other Ambulatory Visit: Payer: Self-pay

## 2020-07-23 ENCOUNTER — Ambulatory Visit (HOSPITAL_COMMUNITY): Payer: Medicaid Other | Attending: Neurological Surgery | Admitting: Physical Therapy

## 2020-07-23 DIAGNOSIS — M542 Cervicalgia: Secondary | ICD-10-CM | POA: Diagnosis present

## 2020-07-23 DIAGNOSIS — R2689 Other abnormalities of gait and mobility: Secondary | ICD-10-CM | POA: Insufficient documentation

## 2020-07-23 NOTE — Therapy (Signed)
[place], [place], [address] Phone: [phone]   Fax:  [phone]  Physical Therapy Treatment  Patient Details  Name: Don Rios MRN: [id number] Date of Birth: [birth year] Referring Provider (PT): Kristeen Miss MD   Encounter Date: 07/23/2020   PT End of Session - 07/23/20 0856    Visit Number 2    Number of Visits 8    Date for PT Re-Evaluation 08/12/20    Authorization Type Medicaid - [employer]    Authorization Time Period 8 visits approved 01/01-01/26    Authorization - Visit Number 2    Authorization - Number of Visits 8    Progress Note Due on Visit 8    PT Start Time 0830    PT Stop Time 0910    PT Time Calculation (min) 40 min    Activity Tolerance Patient tolerated treatment well    Behavior During Therapy [facility] for tasks assessed/performed           Past Medical History:  Diagnosis Date  . Arthritis   . Chronic back pain   . GERD (gastroesophageal reflux disease)   . Headache   . Heart murmur    never given him any problems  . Pneumonia    ? as a child    Past Surgical History:  Procedure Laterality Date  . ANTERIOR CERVICAL DECOMP/DISCECTOMY FUSION N/A 05/11/2020   Procedure: Cervical four-five Cervical five-six Cervical six-seven Anterior cervical decompression/discectomy/fusion;  Surgeon: Kristeen Miss, MD;  Location: [facility];  Service: Neurosurgery;  Laterality: N/A;  3C/RM 20  . arm ORIF Right 3-4 yrs ago  . arm surgery      There were no vitals filed for this visit.   Subjective Assessment - 07/23/20 0843    Subjective pt states his pain is 7/10 today . States he has most difficulty turning his head rt.    Currently in Pain? Yes    Pain Score 7     Pain Location Neck    Pain Orientation Right    Pain Descriptors / Indicators Aching;Radiating;Nagging    Pain Radiating Towards radiating pain into thumb and index finger Rt UE intermittently.                              [facility] Adult PT Treatment/Exercise - 07/23/20 0001      Neck Exercises: Seated   Neck Retraction 10 reps    Neck Retraction Limitations minimal ROM achieved    W Back 10 reps    W Back Limitations "freeze and squeeze" scap retractions    Shoulder Rolls Backwards;10 reps    Other Seated Exercise cervical excursions 5X each                    PT Short Term Goals - 07/23/20 0857      PT SHORT TERM GOAL #1   Title Patient will be independent with HEP in order to improve functional outcomes.    Time 2    Period Weeks    Status On-going    Target Date 07/29/20      PT SHORT TERM GOAL #2   Title Patient will report at least 25% improvement in symptoms for improved quality of life.    Time 2    Period Weeks    Status On-going    Target Date 07/29/20  PT Long Term Goals - 07/23/20 0857      PT LONG TERM GOAL #1   Title Patient will report at least 75% improvement in symptoms for improved quality of life.    Time 4    Period Weeks    Status On-going      PT LONG TERM GOAL #2   Title Patient will demonstrate at least 25% improvement in cervical ROM in all restricted planes for improved ability to move head while completing ADL.    Time 4    Period Weeks    Status On-going                 Plan - 07/23/20 0946    Clinical Impression Statement Reviewed goals and POC moving forward. Initiated AROM for cervical region and began postural strengthening exercises.  Pt with extreme limited Rt cervical sidebend and rotation.   Minimal ROM also completed with cervical retractions.   Pt given written HEP this session and able to demonstrate appropriately.  Manual completed at EOS today with moderate tightness in bilateral scalenes and scap region, Rt>Lt.  Pt reported improvement following manual, however pain level remained the same.    Personal Factors and Comorbidities Behavior Pattern;Past/Current  Experience;Fitness;Comorbidity 2;Education;Time since onset of injury/illness/exacerbation    Comorbidities chronic pain, hx arm injury/surgery    Examination-Activity Limitations Bend;Reach Overhead;Bed Mobility;Sit;Sleep;Lift    Examination-Participation Restrictions Occupation;Cleaning;Community Activity;Yard Work    Conservation officer, historic buildings Evolving/Moderate complexity    Rehab Potential Fair    PT Frequency 2x / week    PT Duration 4 weeks    PT Treatment/Interventions ADLs/Self Care Home Management;Aquatic Therapy;Cryotherapy;Electrical Stimulation;Iontophoresis 4mg /ml Dexamethasone;Moist Heat;Traction;Ultrasound;DME Instruction;Gait training;Stair training;Functional mobility training;Therapeutic activities;Therapeutic exercise;Balance training;Neuromuscular re-education;Patient/family education;Orthotic Fit/Training;Manual techniques;Passive range of motion;Scar mobilization;Dry needling;Energy conservation;Splinting;Taping;Spinal Manipulations;Joint Manipulations    PT Next Visit Plan Continue manual for pain relief with STM.  Add thoracic excursions and progress postural strengthening.    PT Home Exercise Plan 1/6:  cervical excursions 5X each, WBack, cervical retractions    Consulted and Agree with Plan of Care Patient           Patient will benefit from skilled therapeutic intervention in order to improve the following deficits and impairments:  Decreased range of motion,Pain,Impaired UE functional use,Increased muscle spasms,Decreased endurance,Impaired perceived functional ability,Improper body mechanics,Postural dysfunction,Decreased strength,Decreased mobility  Visit Diagnosis: Cervicalgia  Other abnormalities of gait and mobility     Problem List Patient Active Problem List   Diagnosis Date Noted  . Cervical spondylosis with myelopathy 05/11/2020  . Multiple rib fractures 10/17/2019   12/17/2019, PTA/CLT [phone]  [phone] 07/23/2020, 9:48  AM  [place] [facility] [address] [place], [place], [place] Phone: [phone]   Fax:  [phone]  Name: Don Rios MRN: Linde Gillis Date of Birth: [birth year]

## 2020-07-23 NOTE — Patient Instructions (Addendum)
Neck: Retraction    Sit with back and head straight. Pull chin back to line up ear with shoulder. Do not turn or tilt head.May assist if child cannot keep correct position. Hold __5__ seconds. Repeat __10__ times. Do __2__ sessions per day. CAUTION: Movement should be gentle, steady and slow.  SCAPULA: Retraction    Pinch shoulder blades together. Do not shrug shoulders. Hold _5__ seconds. _10__ reps per set, _2__ sets per day

## 2020-07-28 ENCOUNTER — Ambulatory Visit (HOSPITAL_COMMUNITY): Payer: Medicaid Other | Admitting: Physical Therapy

## 2020-07-28 ENCOUNTER — Encounter (HOSPITAL_COMMUNITY): Payer: Self-pay | Admitting: Physical Therapy

## 2020-07-28 ENCOUNTER — Other Ambulatory Visit: Payer: Self-pay

## 2020-07-28 DIAGNOSIS — M542 Cervicalgia: Secondary | ICD-10-CM

## 2020-07-28 DIAGNOSIS — R2689 Other abnormalities of gait and mobility: Secondary | ICD-10-CM

## 2020-07-28 NOTE — Therapy (Signed)
[place], [place], [address] Phone: [phone]   Fax:  [phone]  Physical Therapy Treatment  Patient Details  Name: Don Rios MRN: [id number] Date of Birth: Dec 03, 1965 Referring Provider (PT): Kristeen Miss MD   Encounter Date: 07/28/2020   PT End of Session - 07/28/20 1053    Visit Number 3    Number of Visits 8    Date for PT Re-Evaluation 08/12/20    Authorization Type Medicaid - [employer]    Authorization Time Period 8 visits approved 01/01-01/26    Authorization - Visit Number 3    Authorization - Number of Visits 8    Progress Note Due on Visit 8    PT Start Time 1324    PT Stop Time 1130    PT Time Calculation (min) 38 min    Activity Tolerance Patient tolerated treatment well    Behavior During Therapy [facility] for tasks assessed/performed           Past Medical History:  Diagnosis Date  . Arthritis   . Chronic back pain   . GERD (gastroesophageal reflux disease)   . Headache   . Heart murmur    never given him any problems  . Pneumonia    ? as a child    Past Surgical History:  Procedure Laterality Date  . ANTERIOR CERVICAL DECOMP/DISCECTOMY FUSION N/A 05/11/2020   Procedure: Cervical four-five Cervical five-six Cervical six-seven Anterior cervical decompression/discectomy/fusion;  Surgeon: Kristeen Miss, MD;  Location: [facility];  Service: Neurosurgery;  Laterality: N/A;  3C/RM 20  . arm ORIF Right 3-4 yrs ago  . arm surgery      There were no vitals filed for this visit.   Subjective Assessment - 07/28/20 1053    Subjective Patient states that his neck is bothering him about the same. He has been doing his HEP.    Currently in Pain? Yes    Pain Score 7     Pain Location Neck    Pain Orientation Right    Pain Descriptors / Indicators [facility] Adult PT Treatment/Exercise - 07/28/20 0001      Neck Exercises: Standing   Other  Standing Exercises scapular retraction with [place] ER 2x10 with emphasis on scap retraction      Neck Exercises: Seated   Shoulder Rolls Backwards;10 reps      Neck Exercises: Supine   Neck Retraction 10 reps    Neck Retraction Limitations 2 sets    Other Supine Exercise cervical rotation  2x10 bilateral      Manual Therapy   Manual Therapy Soft tissue mobilization;Joint mobilization    Manual therapy comments completed independently from all other aspects of treatment    Joint Mobilization R and L UPA C2 grade II-III    Soft tissue mobilization cervical parasoinals, R UT                  PT Education - 07/28/20 1126    Education Details Patient educated on HEP, exercise mechanics    Person(s) Educated Patient    Methods Explanation;Demonstration    Comprehension Verbalized understanding;Returned demonstration            PT Short Term Goals - 07/23/20 0857      PT SHORT TERM GOAL #1   Title Patient  will be independent with HEP in order to improve functional outcomes.    Time 2    Period Weeks    Status On-going    Target Date 07/29/20      PT SHORT TERM GOAL #2   Title Patient will report at least 25% improvement in symptoms for improved quality of life.    Time 2    Period Weeks    Status On-going    Target Date 07/29/20             PT Long Term Goals - 07/23/20 0857      PT LONG TERM GOAL #1   Title Patient will report at least 75% improvement in symptoms for improved quality of life.    Time 4    Period Weeks    Status On-going      PT LONG TERM GOAL #2   Title Patient will demonstrate at least 25% improvement in cervical ROM in all restricted planes for improved ability to move head while completing ADL.    Time 4    Period Weeks    Status On-going                 Plan - 07/28/20 1053    Clinical Impression Statement Patient continues to remain limited by pain/stiffness in cervical spine limiting R rotation. Patient has tender and  hyperactive lower cervical paraspinals in which change in tissue tension is noted. Patient continues to have restricted rotation ROM. Completed mobs at C2 to promote rotation and followed that with rotation in supine with increase in ROM following. Patient requires verbal and tactile cueing for cervical retraction with good carry over following. Patient with impaired scapular mobility and shows difficulty with motor control of scapular movements. Patient will benefit from skilled physical therapy in order to improve function and reduce impairment.    Personal Factors and Comorbidities Behavior Pattern;Past/Current Experience;Fitness;Comorbidity 2;Education;Time since onset of injury/illness/exacerbation    Comorbidities chronic pain, hx arm injury/surgery    Examination-Activity Limitations Bend;Reach Overhead;Bed Mobility;Sit;Sleep;Lift    Examination-Participation Restrictions Occupation;Cleaning;Community Activity;Yard Work    Merchant navy officer Evolving/Moderate complexity    Rehab Potential Fair    PT Frequency 2x / week    PT Duration 4 weeks    PT Treatment/Interventions ADLs/Self Care Home Management;Aquatic Therapy;Cryotherapy;Electrical Stimulation;Iontophoresis 4mg /ml Dexamethasone;Moist Heat;Traction;Ultrasound;DME Instruction;Gait training;Stair training;Functional mobility training;Therapeutic activities;Therapeutic exercise;Balance training;Neuromuscular re-education;Patient/family education;Orthotic Fit/Training;Manual techniques;Passive range of motion;Scar mobilization;Dry needling;Energy conservation;Splinting;Taping;Spinal Manipulations;Joint Manipulations    PT Next Visit Plan Continue manual for pain relief with STM.  possibly add thoracic excursions and progress postural strengthening.    PT Home Exercise Plan 1/6:  cervical excursions 5X each, WBack, cervical retractions 1/11 cervical rotation in supine, scapular retraction with GH ER at wall    Consulted and Agree  with Plan of Care Patient           Patient will benefit from skilled therapeutic intervention in order to improve the following deficits and impairments:  Decreased range of motion,Pain,Impaired UE functional use,Increased muscle spasms,Decreased endurance,Impaired perceived functional ability,Improper body mechanics,Postural dysfunction,Decreased strength,Decreased mobility  Visit Diagnosis: Cervicalgia  Other abnormalities of gait and mobility     Problem List Patient Active Problem List   Diagnosis Date Noted  . Cervical spondylosis with myelopathy 05/11/2020  . Multiple rib fractures 10/17/2019    11:32 AM, 07/28/20 Mearl Latin PT, DPT Physical Therapist at [place],  [place], [address] Phone: [phone]   Fax:  [phone]  Name: Don Rios MRN: [id number] Date of Birth: 03-17-1966

## 2020-07-28 NOTE — Patient Instructions (Signed)
Access Code: [facility] URL: [email or web address] Date: 07/28/2020 Prepared by: Mitzi Hansen Jere Bostrom  Exercises Supine Cervical Rotation AROM on Pillow - 1 x daily - 7 x weekly - 2 sets - 10 reps Seated Scapular Retraction with External Rotation - 1 x daily - 7 x weekly - 2 sets - 10 reps

## 2020-07-30 ENCOUNTER — Ambulatory Visit (HOSPITAL_COMMUNITY): Payer: Medicaid Other | Admitting: Physical Therapy

## 2020-07-30 ENCOUNTER — Telehealth (HOSPITAL_COMMUNITY): Payer: Self-pay | Admitting: Physical Therapy

## 2020-07-30 NOTE — Telephone Encounter (Signed)
Patient did not show for 9:15 appointment today. Spoke with patient who stated his schedule said 10:00 and he did not wish to come in this afternoon when offered. Patient confirmed he would be at next appointment.   9:45 AM, 07/30/20 Don Rios PT, DPT Physical Therapist at [facility]

## 2020-08-04 ENCOUNTER — Ambulatory Visit (HOSPITAL_COMMUNITY): Payer: Medicaid Other | Admitting: Physical Therapy

## 2020-08-06 ENCOUNTER — Telehealth (HOSPITAL_COMMUNITY): Payer: Self-pay | Admitting: Physical Therapy

## 2020-08-06 ENCOUNTER — Ambulatory Visit (HOSPITAL_COMMUNITY): Payer: Medicaid Other | Admitting: Physical Therapy

## 2020-08-06 NOTE — Telephone Encounter (Signed)
Patient no show, called patient to make him aware of missed appointment but was unable to leave message or speak with patient.   11:21 AM, 08/06/20 Mearl Latin PT, DPT Physical Therapist at [facility]

## 2020-08-11 ENCOUNTER — Ambulatory Visit (HOSPITAL_COMMUNITY): Payer: Medicaid Other | Admitting: Physical Therapy

## 2020-08-11 ENCOUNTER — Encounter (HOSPITAL_COMMUNITY): Payer: Self-pay | Admitting: Physical Therapy

## 2020-08-11 ENCOUNTER — Telehealth (HOSPITAL_COMMUNITY): Payer: Self-pay | Admitting: Physical Therapy

## 2020-08-11 NOTE — Telephone Encounter (Signed)
Patient no show, spoke with patient who stated his doctor told him his insurance was done so he didn't have to go to PT anymore. Patient stated there was no changes with his insurance though. Educated patient that his current auth was through 08/12/20 from our perspective. Educated patient on discharge from physical therapy and obtaining a new referral to physical therapy if needed.   11:11 AM, 08/11/20 Mearl Latin PT, DPT Physical Therapist at [facility]

## 2020-08-11 NOTE — Therapy (Signed)
[place], [place], [address] Phone: [phone]   Fax:  [phone]  Patient Details  Name: Don Rios MRN: [id number] Date of Birth: 1966/05/29 Referring Provider:  No ref. provider found  Encounter Date: 08/11/2020  PHYSICAL THERAPY DISCHARGE SUMMARY  Visits from Start of Care: 3  Current functional level related to goals / functional outcomes: Unknown as patient has not returned   Remaining deficits: Unknown as patient has not returned   Education / Equipment: HEP  Plan: Patient agrees to discharge.  Patient goals were not met. Patient is being discharged due to the patient's request.  ?????      11:13 AM, 08/11/20 Mearl Latin PT, DPT Physical Therapist at [place], [place], [address] Phone: [phone]   Fax:  [phone]

## 2020-08-13 ENCOUNTER — Encounter (HOSPITAL_COMMUNITY): Payer: Medicaid Other | Admitting: Physical Therapy

## 2020-10-19 ENCOUNTER — Encounter (HOSPITAL_BASED_OUTPATIENT_CLINIC_OR_DEPARTMENT_OTHER): Payer: Self-pay

## 2020-10-19 DIAGNOSIS — R0683 Snoring: Secondary | ICD-10-CM

## 2020-10-21 ENCOUNTER — Other Ambulatory Visit: Payer: Self-pay

## 2020-10-21 ENCOUNTER — Encounter (HOSPITAL_COMMUNITY): Payer: Self-pay

## 2020-10-21 ENCOUNTER — Emergency Department (HOSPITAL_COMMUNITY)
Admission: EM | Admit: 2020-10-21 | Discharge: 2020-10-21 | Disposition: A | Discharge status: Home / Self Care | Payer: Medicaid Other | Attending: Emergency Medicine | Admitting: Emergency Medicine

## 2020-10-21 DIAGNOSIS — M549 Dorsalgia, unspecified: Secondary | ICD-10-CM | POA: Diagnosis present

## 2020-10-21 DIAGNOSIS — R202 Paresthesia of skin: Secondary | ICD-10-CM | POA: Insufficient documentation

## 2020-10-21 DIAGNOSIS — M5442 Lumbago with sciatica, left side: Secondary | ICD-10-CM | POA: Diagnosis not present

## 2020-10-21 DIAGNOSIS — M25552 Pain in left hip: Secondary | ICD-10-CM | POA: Diagnosis not present

## 2020-10-21 MED ORDER — METHOCARBAMOL 500 MG PO TABS: 500.0000 mg | ORAL_TABLET | Freq: Two times a day (BID) | ORAL | 0 refills | Status: DC

## 2020-10-21 MED ORDER — HYDROMORPHONE HCL 1 MG/ML IJ SOLN
1.0000 mg | Freq: Once | INTRAMUSCULAR | Status: AC
  Administered 2020-10-21: 1 mg via INTRAMUSCULAR
  Filled 2020-10-21: qty 1

## 2020-10-21 MED ORDER — PREDNISONE 20 MG PO TABS: 20.0000 mg | ORAL_TABLET | Freq: Every day | ORAL | 0 refills | Status: AC
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
## 2020-10-21 NOTE — Discharge Instructions (Addendum)
You have been seen here for low back pain.  I have given you a prescription for a muscle relaxer please take as prescribed, please be aware this medication can make you drowsy do not consume alcohol or operate heavy machinery while taking this medication.  Also given you a prescription for steroids please take as prescribed.  I recommend taking over-the-counter pain medications like ibuprofen and/or Tylenol every 6 as needed.  Please follow dosage and on the back of bottle.  I also recommend applying heat to the area and stretching out the muscles as this will help decrease stiffness and pain.  I have given you information on exercises please follow.   Please follow-up with your neurosurgeon for further evaluation.   Come back to the emergency department if you develop chest pain, shortness of breath, severe abdominal pain, uncontrolled nausea, vomiting, diarrhea.

## 2020-11-05 ENCOUNTER — Encounter: Payer: Medicaid Other | Admitting: Neurology

## 2020-11-10 ENCOUNTER — Encounter (INDEPENDENT_AMBULATORY_CARE_PROVIDER_SITE_OTHER): Payer: Self-pay | Admitting: *Deleted

## 2020-11-11 ENCOUNTER — Ambulatory Visit: Payer: Medicaid Other | Admitting: Cardiology

## 2020-11-11 ENCOUNTER — Encounter: Payer: Self-pay | Admitting: Cardiology

## 2020-11-11 NOTE — Progress Notes (Deleted)
     Clinical Summary Don Rios is a 55 y.o.male  1. Heart murmur   Past Medical History:  Diagnosis Date  . Arthritis   . Chronic back pain   . GERD (gastroesophageal reflux disease)   . Headache   . Heart murmur    never given him any problems  . Pneumonia    ? as a child     No Known Allergies   Current Outpatient Medications  Medication Sig Dispense Refill  . acetaminophen (TYLENOL) 500 MG tablet Take 500-1,000 mg by mouth every 6 (six) hours as needed for mild pain or headache.    . diazepam (VALIUM) 5 MG tablet Take 1 tablet (5 mg total) by mouth every 6 (six) hours as needed for muscle spasms. 20 tablet 0  . methocarbamol (ROBAXIN) 500 MG tablet Take 1 tablet (500 mg total) by mouth 2 (two) times daily. 20 tablet 0  . oxyCODONE-acetaminophen (PERCOCET/ROXICET) 5-325 MG tablet Take 1-2 tablets by mouth every 6 (six) hours as needed for moderate pain or severe pain. 30 tablet 0   No current facility-administered medications for this visit.     Past Surgical History:  Procedure Laterality Date  . ANTERIOR CERVICAL DECOMP/DISCECTOMY FUSION N/A 05/11/2020   Procedure: Cervical four-five Cervical five-six Cervical six-seven Anterior cervical decompression/discectomy/fusion;  Surgeon: Kristeen Miss, MD;  Location: [place];  Service: Neurosurgery;  Laterality: N/A;  3C/RM 20  . arm ORIF Right 3-4 yrs ago  . arm surgery       No Known Allergies    No family history on file.   Social History Don Rios reports that he has never smoked. He has never used smokeless tobacco. Don Rios reports current alcohol use.   Review of Systems CONSTITUTIONAL: No weight loss, fever, chills, weakness or fatigue.  HEENT: Eyes: No visual loss, blurred vision, double vision or yellow sclerae.No hearing loss, sneezing, congestion, runny nose or sore throat.  SKIN: No rash or itching.  CARDIOVASCULAR:  RESPIRATORY: No shortness of breath, cough or sputum.  GASTROINTESTINAL: No  anorexia, nausea, vomiting or diarrhea. No abdominal pain or blood.  GENITOURINARY: No burning on urination, no polyuria NEUROLOGICAL: No headache, dizziness, syncope, paralysis, ataxia, numbness or tingling in the extremities. No change in bowel or bladder control.  MUSCULOSKELETAL: No muscle, back pain, joint pain or stiffness.  LYMPHATICS: No enlarged nodes. No history of splenectomy.  PSYCHIATRIC: No history of depression or anxiety.  ENDOCRINOLOGIC: No reports of sweating, cold or heat intolerance. No polyuria or polydipsia.  Marland Kitchen   Physical Examination There were no vitals filed for this visit. There were no vitals filed for this visit.  Gen: resting comfortably, no acute distress HEENT: no scleral icterus, pupils equal round and reactive, no palptable cervical adenopathy,  CV Resp: Clear to auscultation bilaterally GI: abdomen is soft, non-tender, non-distended, normal bowel sounds, no hepatosplenomegaly MSK: extremities are warm, no edema.  Skin: warm, no rash Neuro:  no focal deficits Psych: appropriate affect   Diagnostic Studies 03/2016 nuclear stress 1. No reversible ischemia or infarction.   2. Normal left ventricular wall motion.   3. Left ventricular ejection fraction 61%   4. Non invasive risk stratification*: Low risk.         Assessment and Plan        Don Rios, M.D., F.A.C.C.

## 2020-11-17 DIAGNOSIS — R011 Cardiac murmur, unspecified: Secondary | ICD-10-CM

## 2020-11-17 HISTORY — DX: Cardiac murmur, unspecified: R01.1

## 2021-01-06 ENCOUNTER — Ambulatory Visit: Payer: Self-pay | Admitting: Cardiology

## 2021-01-08 ENCOUNTER — Telehealth: Payer: Self-pay | Admitting: Cardiology

## 2021-01-08 ENCOUNTER — Other Ambulatory Visit: Payer: Self-pay

## 2021-01-08 ENCOUNTER — Encounter: Payer: Self-pay | Admitting: Cardiology

## 2021-01-08 ENCOUNTER — Ambulatory Visit (INDEPENDENT_AMBULATORY_CARE_PROVIDER_SITE_OTHER): Payer: Self-pay | Admitting: Cardiology

## 2021-01-08 VITALS — BP 122/74 | HR 101 | Ht 69.0 in | Wt 220.4 lb

## 2021-01-08 DIAGNOSIS — Q231 Congenital insufficiency of aortic valve: Secondary | ICD-10-CM

## 2021-01-08 DIAGNOSIS — I350 Nonrheumatic aortic (valve) stenosis: Secondary | ICD-10-CM

## 2021-01-08 DIAGNOSIS — I351 Nonrheumatic aortic (valve) insufficiency: Secondary | ICD-10-CM

## 2021-01-08 NOTE — Telephone Encounter (Signed)
Checking percert on the following patient for testing scheduled at [facility].     CT ANGIO CHEST AORTA W/CM &/OR   02/01/2021

## 2021-01-08 NOTE — Patient Instructions (Addendum)
Medication Instructions:  Your physician recommends that you continue on your current medications as directed. Please refer to the Current Medication list given to you today.  Labwork: none  Testing/Procedures: CTA chest & aorta  Follow-Up: Your physician recommends that you schedule a follow-up appointment in: 6 months  Any Other Special Instructions Will Be Listed Below (If Applicable).  If you need a refill on your cardiac medications before your next appointment, please call your pharmacy.

## 2021-01-08 NOTE — Progress Notes (Signed)
Clinical Summary Don Rios is a 55 y.o.male seen today as a new consult, referred by Dr Don Rios for the following medical problems.   Heart murmur/Possible bicuspid AV - 11/2020 echo [facility]: LVEF 60-65%, grade I dd, mild to mod AI, mild AS. Notes mention likely bicuspid AV - denies any chest, no SOB/DOE       Past Medical History:  Diagnosis Date   Arthritis    Chronic back pain    GERD (gastroesophageal reflux disease)    Headache    Heart murmur    never given him any problems   Pneumonia    ? as a child     No Known Allergies   Current Outpatient Medications  Medication Sig Dispense Refill   acetaminophen (TYLENOL) 500 MG tablet Take 500-1,000 mg by mouth every 6 (six) hours as needed for mild pain or headache.     diazepam (VALIUM) 5 MG tablet Take 1 tablet (5 mg total) by mouth every 6 (six) hours as needed for muscle spasms. 20 tablet 0   methocarbamol (ROBAXIN) 500 MG tablet Take 1 tablet (500 mg total) by mouth 2 (two) times daily. 20 tablet 0   oxyCODONE-acetaminophen (PERCOCET/ROXICET) 5-325 MG tablet Take 1-2 tablets by mouth every 6 (six) hours as needed for moderate pain or severe pain. 30 tablet 0   No current facility-administered medications for this visit.     Past Surgical History:  Procedure Laterality Date   ANTERIOR CERVICAL DECOMP/DISCECTOMY FUSION N/A 05/11/2020   Procedure: Cervical four-five Cervical five-six Cervical six-seven Anterior cervical decompression/discectomy/fusion;  Surgeon: Kristeen Miss, MD;  Location: [place];  Service: Neurosurgery;  Laterality: N/A;  3C/RM 20   arm ORIF Right 3-4 yrs ago   arm surgery       No Known Allergies    No family history on file.   Social History Mr. Don Rios reports that he has never smoked. He has never used smokeless tobacco. Mr. Don Rios reports current alcohol use.   Review of Systems CONSTITUTIONAL: No weight loss, fever, chills, weakness or fatigue.  HEENT: Eyes: No visual loss,  blurred vision, double vision or yellow sclerae.No hearing loss, sneezing, congestion, runny nose or sore throat.  SKIN: No rash or itching.  CARDIOVASCULAR: per hpi RESPIRATORY: No shortness of breath, cough or sputum.  GASTROINTESTINAL: No anorexia, nausea, vomiting or diarrhea. No abdominal pain or blood.  GENITOURINARY: No burning on urination, no polyuria NEUROLOGICAL: No headache, dizziness, syncope, paralysis, ataxia, numbness or tingling in the extremities. No change in bowel or bladder control.  MUSCULOSKELETAL: No muscle, back pain, joint pain or stiffness.  LYMPHATICS: No enlarged nodes. No history of splenectomy.  PSYCHIATRIC: No history of depression or anxiety.  ENDOCRINOLOGIC: No reports of sweating, cold or heat intolerance. No polyuria or polydipsia.  Marland Kitchen   Physical Examination Today's Vitals   01/08/21 1442  BP: 122/74  Pulse: (!) 101  SpO2: 96%  Weight: 220 lb 6.4 oz (100 kg)  Height: 5\' 9"  (1.753 m)   Body mass index is 32.55 kg/m.  Gen: resting comfortably, no acute distress HEENT: no scleral icterus, pupils equal round and reactive, no palptable cervical adenopathy,  CV: RRR, 2/6 systolic murmur rusb, no jvd Resp: Clear to auscultation bilaterally GI: abdomen is soft, non-tender, non-distended, normal bowel sounds, no hepatosplenomegaly MSK: extremities are warm, no edema.  Skin: warm, no rash Neuro:  no focal deficits Psych: appropriate affect   Diagnostic Studies  11/2020 echo Summary    1.  Technically difficult study.    2. The left ventricle is normal in size with mildly increased wall  thickness.    3. The left ventricular systolic function is normal, LVEF is visually  estimated at 60-65%.    4. There is grade I diastolic dysfunction (impaired relaxation).    5. There is mild to moderate aortic regurgitation.    6. There is mild aortic valve stenosis.    7. The right ventricle is normal in size, with normal systolic function.    03/2016  nuclear stress [employer] 1. No reversible ischemia or infarction.   2. Normal left ventricular wall motion.   3. Left ventricular ejection fraction 61%   4. Non invasive risk stratification*: Low risk.    Assessment and Plan  Aortic stenosis/Aortic regurgitation/Probable bicuspid AV - mild AS, mild to mod AI without symptoms. Probable bicuspid AV based on imaging, would make sense based on valvular dysfunction at his age - obtain CTA to evaluate aorta for possible coexisting aortopathy  EKG today shows NSR  F/u 6 months       Don Rios, M.D.

## 2021-01-28 ENCOUNTER — Other Ambulatory Visit: Payer: Self-pay | Admitting: Cardiology

## 2021-01-29 LAB — BASIC METABOLIC PANEL
BUN/Creatinine Ratio: 17 (ref 9–20)
BUN: 19 mg/dL (ref 6–24)
CO2: 22 mmol/L (ref 20–29)
Calcium: 9.1 mg/dL (ref 8.7–10.2)
Chloride: 104 mmol/L (ref 96–106)
Creatinine, Ser: 1.1 mg/dL (ref 0.76–1.27)
Glucose: 104 mg/dL — ABNORMAL HIGH (ref 65–99)
Potassium: 4.8 mmol/L (ref 3.5–5.2)
Sodium: 139 mmol/L (ref 134–144)
eGFR: 80 mL/min/{1.73_m2} (ref >59)

## 2021-01-29 LAB — SPECIMEN STATUS REPORT

## 2021-02-01 ENCOUNTER — Other Ambulatory Visit: Payer: Self-pay

## 2021-02-01 ENCOUNTER — Ambulatory Visit (HOSPITAL_COMMUNITY)
Admission: RE | Admit: 2021-02-01 | Discharge: 2021-02-01 | Disposition: A | Discharge status: Home / Self Care | Payer: Medicaid Other | Source: Ambulatory Visit | Attending: Cardiology | Admitting: Cardiology

## 2021-02-01 DIAGNOSIS — Q231 Congenital insufficiency of aortic valve: Secondary | ICD-10-CM | POA: Diagnosis present

## 2021-02-01 MED ORDER — IOHEXOL 350 MG/ML SOLN
75.0000 mL | Freq: Once | INTRAVENOUS | Status: AC | PRN
  Administered 2021-02-01: 75 mL via INTRAVENOUS

## 2021-02-03 ENCOUNTER — Telehealth: Payer: Self-pay

## 2021-02-03 NOTE — Telephone Encounter (Signed)
-----   Message from Arnoldo Lenis, MD sent at 02/02/2021  9:41 AM EDT ----- Normal CT scan, no evidence of any abnormalities of the aorta  J Branch DM

## 2021-02-03 NOTE — Telephone Encounter (Signed)
Patient notified and verbalized understanding. Pt had no questions or concerns at this time 

## 2021-03-11 ENCOUNTER — Ambulatory Visit (INDEPENDENT_AMBULATORY_CARE_PROVIDER_SITE_OTHER): Payer: Medicaid Other | Admitting: Gastroenterology

## 2021-03-11 ENCOUNTER — Encounter (INDEPENDENT_AMBULATORY_CARE_PROVIDER_SITE_OTHER): Payer: Self-pay | Admitting: Gastroenterology

## 2021-03-11 ENCOUNTER — Encounter (INDEPENDENT_AMBULATORY_CARE_PROVIDER_SITE_OTHER): Payer: Self-pay

## 2021-03-11 ENCOUNTER — Other Ambulatory Visit (INDEPENDENT_AMBULATORY_CARE_PROVIDER_SITE_OTHER): Payer: Self-pay

## 2021-03-11 ENCOUNTER — Other Ambulatory Visit: Payer: Self-pay

## 2021-03-11 DIAGNOSIS — C189 Malignant neoplasm of colon, unspecified: Secondary | ICD-10-CM | POA: Insufficient documentation

## 2021-03-11 DIAGNOSIS — Z800 Family history of malignant neoplasm of digestive organs: Secondary | ICD-10-CM | POA: Diagnosis not present

## 2021-03-11 DIAGNOSIS — C187 Malignant neoplasm of sigmoid colon: Secondary | ICD-10-CM | POA: Diagnosis not present

## 2021-03-11 DIAGNOSIS — R1310 Dysphagia, unspecified: Secondary | ICD-10-CM

## 2021-03-11 NOTE — Patient Instructions (Addendum)
Schedule EGD  Follow up with Dr. Ladona Horns regarding colon cancer management Repeat colonoscopy in 1 year

## 2021-03-11 NOTE — Progress Notes (Signed)
Don Rios, M.D. Gastroenterology & Hepatology [facility] [address] [place], [place] [address] Primary Care Physician: Don Finlay, MD [place] [address]  Referring MD: PCP  Chief Complaint:  dysphagia  History of Present Illness: Don Rios is a 55 y.o. male with PMH HTN, GERD, arthritis, colon cancer and cervical spinal disease status post recent surgery, who presents for evaluation of dysphagia.  The patient underwent anterior cervical decompression and discectomy on October 2021.  Since then he has presented episodes of dysphagia. He reports that prior to his surgery he did not have any problems swallowing. He reports that he feels "the food gets stuck in the middle of his neck". He reports he needs to cough it for it to go down but never regurgitates. This happens with both solids and liquids, happens close to 50% of the times he swallows. No odynophagia or heartburn.  Patient underwent a screening colonoscopy at [facility] on 02/25/2021.  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  Adenocarcinoma was well to moderately differentiated.  He was scheduled to undergo a chest x-ray, which was WNL, CT of the abdomen and pelvis with IV contrast is pending. He will follow-up with Dr. Ladona Rios regarding plans of possible colectomy.  The patient denies having any nausea, vomiting, fever, chills, hematochezia, melena, hematemesis, abdominal distention, abdominal pain, diarrhea, jaundice, pruritus or weight loss.  Last [id number] Last Colonoscopy:02/25/2021 - results not available, wife reports he had 4 polyps, only have the path for the intramucosal cancer  FHx: neg for any gastrointestinal/liver disease, brother colon cancer diagnosed at age 36 Social: neg smoking, alcohol or illicit drug  use Surgical: no abdominal surgeries  Past Medical History: Past Medical History:  Diagnosis Date   Arthritis    Chronic back pain    GERD (gastroesophageal reflux disease)    Headache    Heart murmur    never given him any problems   Pneumonia    ? as a child    Past Surgical History: Past Surgical History:  Procedure Laterality Date   ANTERIOR CERVICAL DECOMP/DISCECTOMY FUSION N/A 05/11/2020   Procedure: Cervical four-five Cervical five-six Cervical six-seven Anterior cervical decompression/discectomy/fusion;  Surgeon: Don Miss, MD;  Location: [place];  Service: Neurosurgery;  Laterality: N/A;  3C/RM 20   arm ORIF Right 3-4 yrs ago   arm surgery      Family History:History reviewed. No pertinent family history.  Social History: Social History   Tobacco Use  Smoking Status Never  Smokeless Tobacco Never   Social History   Substance and Sexual Activity  Alcohol Use Yes   Comment: occasionally   Social History   Substance and Sexual Activity  Drug Use Never    Allergies: No Known Allergies  Medications: Current Outpatient Medications  Medication Sig Dispense Refill   acetaminophen (TYLENOL) 500 MG tablet Take 500-1,000 mg by mouth every 6 (six) hours as needed for mild pain or headache.     amitriptyline (ELAVIL) 50 MG tablet Take 50 mg by mouth at bedtime.     olmesartan (BENICAR) 5 MG tablet Take 5 mg by mouth daily.     No current facility-administered medications for this visit.    Review of Systems: GENERAL: negative for malaise, night sweats HEENT: No changes in hearing or vision, no nose bleeds or other nasal  problems. NECK: Negative for lumps, goiter, pain and significant neck swelling RESPIRATORY: Negative for cough, wheezing CARDIOVASCULAR: Negative for chest pain, leg swelling, palpitations, orthopnea GI: SEE HPI MUSCULOSKELETAL: Negative for joint pain or swelling, back pain, and muscle pain. SKIN: Negative for lesions, rash PSYCH:  Negative for sleep disturbance, mood disorder and recent psychosocial stressors. HEMATOLOGY Negative for prolonged bleeding, bruising easily, and swollen nodes. ENDOCRINE: Negative for cold or heat intolerance, polyuria, polydipsia and goiter. NEURO: negative for tremor, gait imbalance, syncope and seizures. The remainder of the review of systems is noncontributory.   Physical Exam: BP 111/71 (BP Location: Left Arm, Patient Position: Sitting, Cuff Size: Large)   Pulse 75   Temp 98.2 F (36.8 C) (Oral)   Ht '5\' 9"'$  (1.753 m)   Wt 225 lb 14.4 oz (102.5 kg)   BMI 33.36 kg/m  GENERAL: The patient is AO x3, in no acute distress. HEENT: Head is normocephalic and atraumatic. EOMI are intact. Mouth is well hydrated and without lesions. NECK: Supple. No masses LUNGS: Clear to auscultation. No presence of rhonchi/wheezing/rales. Adequate chest expansion HEART: RRR, normal s1 and s2. ABDOMEN: Soft, nontender, no guarding, no peritoneal signs, and nondistended. BS +. No masses. EXTREMITIES: Without any cyanosis, clubbing, rash, lesions or edema. NEUROLOGIC: AOx3, no focal motor deficit. SKIN: no jaundice, no rashes   Imaging/Labs: as above  I personally reviewed and interpreted the available labs, imaging and endoscopic files.  Impression and Plan: Don Rios is a 55 y.o. male with PMH HTN, GERD, arthritis, colon cancer and cervical spinal disease status post recent surgery, who presents for evaluation of dysphagia. The patient has presented persistent and recurrent dysphagia events. All this has happened after she had her cervical surgery. I explained there was a possibility that her symptoms are related to post surgical complication given the location of her cervical surgery. We will explore this further with an EGD and a MBS. I explained to the patient and his wife that these symptoms may not be reversible, even after undergoing dilation. Patient understood and agreed.  In terms of her  colorectal cancer, it is unclear if this was only intramucosal as it had free margins. This is actively being managed by Dr. Ladona Rios, he will need to follow with him for this purpose. I explained that he will need a repeat colonoscopy a year after his colectomy.  - Schedule EGD with ED - Schedule modified barium swallow - Follow up with Dr. Ladona Rios regarding colon cancer management - Repeat colonoscopy in 1 year after colectomy  All questions were answered.      Don Peppers, MD Gastroenterology and Hepatology [facility]
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
## 2021-04-01 ENCOUNTER — Encounter (HOSPITAL_COMMUNITY): Payer: Self-pay

## 2021-04-01 ENCOUNTER — Ambulatory Visit (HOSPITAL_COMMUNITY): Admit: 2021-04-01 | Payer: Medicaid Other | Admitting: Internal Medicine

## 2021-04-01 SURGERY — EGD (ESOPHAGOGASTRODUODENOSCOPY)
Anesthesia: Moderate Sedation

## 2021-05-04 HISTORY — PX: COLECTOMY: SHX59

## 2021-07-15 ENCOUNTER — Encounter: Payer: Self-pay | Admitting: Cardiology

## 2021-07-15 ENCOUNTER — Ambulatory Visit: Payer: Medicaid Other | Admitting: Cardiology

## 2021-07-15 NOTE — Progress Notes (Deleted)
Clinical Summary Don Rios is a 55 y.o.male  Heart murmur/Possible bicuspid AV - 11/2020 echo [facility]: LVEF 60-65%, grade I dd, mild to mod AI, mild AS. Notes mention likely bicuspid AV - denies any chest, no SOB/DOE   01/2021 CTA: normal aorta Past Medical History:  Diagnosis Date   Arthritis    Chronic back pain    GERD (gastroesophageal reflux disease)    Headache    Heart murmur    never given him any problems   Pneumonia    ? as a child     No Known Allergies   Current Outpatient Medications  Medication Sig Dispense Refill   acetaminophen (TYLENOL) 500 MG tablet Take 500-1,000 mg by mouth every 6 (six) hours as needed for mild pain or headache.     amitriptyline (ELAVIL) 50 MG tablet Take 50 mg by mouth at bedtime.     olmesartan (BENICAR) 5 MG tablet Take 5 mg by mouth daily.     No current facility-administered medications for this visit.     Past Surgical History:  Procedure Laterality Date   ANTERIOR CERVICAL DECOMP/DISCECTOMY FUSION N/A 05/11/2020   Procedure: Cervical four-five Cervical five-six Cervical six-seven Anterior cervical decompression/discectomy/fusion;  Surgeon: Kristeen Miss, MD;  Location: [place];  Service: Neurosurgery;  Laterality: N/A;  3C/RM 20   arm ORIF Right 3-4 yrs ago   arm surgery       No Known Allergies    No family history on file.   Social History Don Rios reports that he has never smoked. He has never used smokeless tobacco. Don Rios reports current alcohol use.   Review of Systems CONSTITUTIONAL: No weight loss, fever, chills, weakness or fatigue.  HEENT: Eyes: No visual loss, blurred vision, double vision or yellow sclerae.No hearing loss, sneezing, congestion, runny nose or sore throat.  SKIN: No rash or itching.  CARDIOVASCULAR:  RESPIRATORY: No shortness of breath, cough or sputum.  GASTROINTESTINAL: No anorexia, nausea, vomiting or diarrhea. No abdominal pain or blood.  GENITOURINARY: No burning on  urination, no polyuria NEUROLOGICAL: No headache, dizziness, syncope, paralysis, ataxia, numbness or tingling in the extremities. No change in bowel or bladder control.  MUSCULOSKELETAL: No muscle, back pain, joint pain or stiffness.  LYMPHATICS: No enlarged nodes. No history of splenectomy.  PSYCHIATRIC: No history of depression or anxiety.  ENDOCRINOLOGIC: No reports of sweating, cold or heat intolerance. No polyuria or polydipsia.  Marland Kitchen   Physical Examination There were no vitals filed for this visit. There were no vitals filed for this visit.  Gen: resting comfortably, no acute distress HEENT: no scleral icterus, pupils equal round and reactive, no palptable cervical adenopathy,  CV Resp: Clear to auscultation bilaterally GI: abdomen is soft, non-tender, non-distended, normal bowel sounds, no hepatosplenomegaly MSK: extremities are warm, no edema.  Skin: warm, no rash Neuro:  no focal deficits Psych: appropriate affect   Diagnostic Studies  11/2020 echo Summary    1. Technically difficult study.    2. The left ventricle is normal in size with mildly increased wall  thickness.    3. The left ventricular systolic function is normal, LVEF is visually  estimated at 60-65%.    4. There is grade I diastolic dysfunction (impaired relaxation).    5. There is mild to moderate aortic regurgitation.    6. There is mild aortic valve stenosis.    7. The right ventricle is normal in size, with normal systolic function.  03/2016 nuclear stress [employer] 1. No reversible ischemia or infarction.   2. Normal left ventricular wall motion.   3. Left ventricular ejection fraction 61%   4. Non invasive risk stratification*: Low risk.    Assessment and Plan  Aortic stenosis/Aortic regurgitation/Probable bicuspid AV - mild AS, mild to mod AI without symptoms. Probable bicuspid AV based on imaging, would make sense based on valvular dysfunction at his age - obtain CTA to evaluate aorta  for possible coexisting aortopathy      Arnoldo Lenis, M.D., F.A.C.C.

## 2021-09-06 ENCOUNTER — Other Ambulatory Visit (HOSPITAL_COMMUNITY): Payer: Self-pay | Admitting: Neurological Surgery

## 2021-09-06 DIAGNOSIS — M5412 Radiculopathy, cervical region: Secondary | ICD-10-CM

## 2021-09-17 ENCOUNTER — Other Ambulatory Visit: Payer: Self-pay

## 2021-09-17 ENCOUNTER — Ambulatory Visit (HOSPITAL_COMMUNITY)
Admission: RE | Admit: 2021-09-17 | Discharge: 2021-09-17 | Disposition: A | Discharge status: Home / Self Care | Payer: Medicaid Other | Source: Ambulatory Visit | Attending: Neurological Surgery | Admitting: Neurological Surgery

## 2021-09-17 DIAGNOSIS — M5412 Radiculopathy, cervical region: Secondary | ICD-10-CM | POA: Insufficient documentation

## 2021-09-24 ENCOUNTER — Other Ambulatory Visit: Payer: Self-pay | Admitting: Neurological Surgery

## 2021-09-30 NOTE — Pre-Procedure Instructions (Signed)
Surgical Instructions ? ? ? Your procedure is scheduled on Wednesday, March 23rd. ? Report to [facility] Main Entrance "A" at 5:30 A.M., then check in with the Admitting office. ? Call this number if you have problems the morning of surgery: ? [phone] ? ? If you have any questions prior to your surgery date call [phone]: Open Monday-Friday 8am-4pm ? ? ? Remember: ? Do not eat or drink after midnight the night before your surgery ?  ? Take these medicines the morning of surgery with A SIP OF WATER  ?amLODipine (NORVASC)  ?acetaminophen (TYLENOL)-as needed ? ?As of today, STOP taking any Aspirin (unless otherwise instructed by your surgeon) Aleve, Naproxen, Ibuprofen, Motrin, Advil, Goody's, BC's, all herbal medications, fish oil, and all vitamins. ?         ?WHAT DO I DO ABOUT MY DIABETES MEDICATION? ? ? ?Do not take metFORMIN (GLUCOPHAGE) the morning of surgery. ? ? ?HOW TO MANAGE YOUR DIABETES ?BEFORE AND AFTER SURGERY ? ?Why is it important to control my blood sugar before and after surgery? ?Improving blood sugar levels before and after surgery helps healing and can limit problems. ?A way of improving blood sugar control is eating a healthy diet by: ? Eating less sugar and carbohydrates ? Increasing activity/exercise ? Talking with your doctor about reaching your blood sugar goals ?High blood sugars (greater than 180 mg/dL) can raise your risk of infections and slow your recovery, so you will need to focus on controlling your diabetes during the weeks before surgery. ?Make sure that the doctor who takes care of your diabetes knows about your planned surgery including the date and location. ? ?How do I manage my blood sugar before surgery? ?Check your blood sugar at least 4 times a day, starting 2 days before surgery, to make sure that the level is not too high or low. ? ?Check your blood sugar the morning of your surgery when you wake up and every 2 hours until you get to the Short Stay unit. ? ?If your  blood sugar is less than 70 mg/dL, you will need to treat for low blood sugar: ?Do not take insulin. ?Treat a low blood sugar (less than 70 mg/dL) with ? cup of clear juice (cranberry or apple), 4 glucose tablets, OR glucose gel. ?Recheck blood sugar in 15 minutes after treatment (to make sure it is greater than 70 mg/dL). If your blood sugar is not greater than 70 mg/dL on recheck, call [phone] for further instructions. ?Report your blood sugar to the short stay nurse when you get to Short Stay. ? ?If you are admitted to the hospital after surgery: ?Your blood sugar will be checked by the staff and you will probably be given insulin after surgery (instead of oral diabetes medicines) to make sure you have good blood sugar levels. ?The goal for blood sugar control after surgery is 80-180 mg/dL. ? ?           ?Do NOT Smoke (Tobacco/Vaping) for 24 hours prior to your procedure. ? ?If you use a CPAP at night, you may bring your mask/headgear for your overnight stay. ?  ?Contacts, glasses, piercing's, hearing aid's, dentures or partials may not be worn into surgery, please bring cases for these belongings.  ?  ?For patients admitted to the hospital, discharge time will be determined by your treatment team. ?  ?Patients discharged the day of surgery will not be allowed to drive home, and someone needs to stay with them for 24  hours. ? ?NO VISITORS WILL BE ALLOWED IN PRE-OP WHERE PATIENTS ARE PREPPED FOR SURGERY.  ONLY 1 SUPPORT PERSON MAY BE PRESENT IN THE WAITING ROOM WHILE YOU ARE IN SURGERY.  IF YOU ARE TO BE ADMITTED, ONCE YOU ARE IN YOUR ROOM YOU WILL BE ALLOWED TWO (2) VISITORS. (1) VISITOR MAY STAY OVERNIGHT BUT MUST ARRIVE TO THE ROOM BY 8pm.  Minor children may have two parents present. Special consideration for safety and communication needs will be reviewed on a case by case basis. ? ? ?Special instructions:   ?[place]- Preparing For Surgery ? ?Before surgery, you can play an important role. Because  skin is not sterile, your skin needs to be as free of germs as possible. You can reduce the number of germs on your skin by washing with CHG (chlorahexidine gluconate) Soap before surgery.  CHG is an antiseptic cleaner which kills germs and bonds with the skin to continue killing germs even after washing.   ? ?Oral Hygiene is also important to reduce your risk of infection.  Remember - BRUSH YOUR TEETH THE MORNING OF SURGERY WITH YOUR REGULAR TOOTHPASTE ? ?Please do not use if you have an allergy to CHG or antibacterial soaps. If your skin becomes reddened/irritated stop using the CHG.  ?Do not shave (including legs and underarms) for at least 48 hours prior to first CHG shower. It is OK to shave your face. ? ?Please follow these instructions carefully. ?  ?Shower the NIGHT BEFORE SURGERY and the MORNING OF SURGERY ? ?If you chose to wash your hair, wash your hair first as usual with your normal shampoo. ? ?After you shampoo, rinse your hair and body thoroughly to remove the shampoo. ? ?Use CHG Soap as you would any other liquid soap. You can apply CHG directly to the skin and wash gently with a scrungie or a clean washcloth.  ? ?Apply the CHG Soap to your body ONLY FROM THE NECK DOWN.  Do not use on open wounds or open sores. Avoid contact with your eyes, ears, mouth and genitals (private parts). Wash Face and genitals (private parts)  with your normal soap.  ? ?Wash thoroughly, paying special attention to the area where your surgery will be performed. ? ?Thoroughly rinse your body with warm water from the neck down. ? ?DO NOT shower/wash with your normal soap after using and rinsing off the CHG Soap. ? ?Pat yourself dry with a CLEAN TOWEL. ? ?Wear CLEAN PAJAMAS to bed the night before surgery ? ?Place CLEAN SHEETS on your bed the night before your surgery ? ?DO NOT SLEEP WITH PETS. ? ? ?Day of Surgery: ?Take a shower with CHG soap. ?Do not wear jewelry. ?Do not wear lotions, powders, colognes, or deodorant. ?Men  may shave face and neck. ?Do not bring valuables to the hospital.  ?[facility] is not responsible for any belongings or valuables. ?Do not apply any deodorants/lotions.   ?Remember to brush your teeth WITH YOUR REGULAR TOOTHPASTE. ?  ?Please read over the following fact sheets that you were given. ? ?? Notify your provider: ? ?o if you are in close contact with someone who has COVID ? ?o or if you develop a fever of 100.4 or greater, sneezing, cough, sore throat, shortness of breath or body aches.  ? ?

## 2021-10-01 ENCOUNTER — Other Ambulatory Visit: Payer: Self-pay

## 2021-10-01 ENCOUNTER — Encounter (HOSPITAL_COMMUNITY): Payer: Self-pay

## 2021-10-01 ENCOUNTER — Encounter (HOSPITAL_COMMUNITY)
Admission: RE | Admit: 2021-10-01 | Discharge: 2021-10-01 | Disposition: A | Discharge status: Home / Self Care | Payer: Medicaid Other | Source: Ambulatory Visit | Attending: Neurological Surgery | Admitting: Neurological Surgery

## 2021-10-01 VITALS — BP 117/72 | HR 76 | Temp 97.7°F | Resp 18 | Ht 69.0 in | Wt 225.1 lb

## 2021-10-01 DIAGNOSIS — Z01812 Encounter for preprocedural laboratory examination: Secondary | ICD-10-CM | POA: Diagnosis present

## 2021-10-01 DIAGNOSIS — Z01818 Encounter for other preprocedural examination: Secondary | ICD-10-CM

## 2021-10-01 DIAGNOSIS — I2510 Atherosclerotic heart disease of native coronary artery without angina pectoris: Secondary | ICD-10-CM | POA: Diagnosis not present

## 2021-10-01 HISTORY — DX: Essential (primary) hypertension: I10

## 2021-10-01 HISTORY — DX: Malignant (primary) neoplasm, unspecified: C80.1

## 2021-10-01 HISTORY — DX: Prediabetes: R73.03

## 2021-10-01 LAB — CBC
HCT: 45 % (ref 39.0–52.0)
Hemoglobin: 14.7 g/dL (ref 13.0–17.0)
MCH: 28 pg (ref 26.0–34.0)
MCHC: 32.7 g/dL (ref 30.0–36.0)
MCV: 85.7 fL (ref 80.0–100.0)
Platelets: 220 10*3/uL (ref 150–400)
RBC: 5.25 MIL/uL (ref 4.22–5.81)
RDW: 14.2 % (ref 11.5–15.5)
WBC: 9.5 10*3/uL (ref 4.0–10.5)
nRBC: 0 % (ref 0.0–0.2)

## 2021-10-01 LAB — TYPE AND SCREEN
ABO/RH(D): A POS
Antibody Screen: NEGATIVE

## 2021-10-01 LAB — SURGICAL PCR SCREEN
MRSA, PCR: NEGATIVE
Staphylococcus aureus: NEGATIVE

## 2021-10-01 LAB — BASIC METABOLIC PANEL
Anion gap: 6 (ref 5–15)
BUN: 12 mg/dL (ref 6–20)
CO2: 24 mmol/L (ref 22–32)
Calcium: 8.7 mg/dL — ABNORMAL LOW (ref 8.9–10.3)
Chloride: 109 mmol/L (ref 98–111)
Creatinine, Ser: 1.32 mg/dL — ABNORMAL HIGH (ref 0.61–1.24)
GFR, Estimated: >60 mL/min (ref >60)
Glucose, Bld: 108 mg/dL — ABNORMAL HIGH (ref 70–99)
Potassium: 4.4 mmol/L (ref 3.5–5.1)
Sodium: 139 mmol/L (ref 135–145)

## 2021-10-01 NOTE — Progress Notes (Addendum)
PCP - Dr. Maeola Sarah ?Cardiologist - denies ? ?PPM/ICD - n/a ? ?Chest x-ray - n/a ?EKG - 01/08/21 ?Stress Test - 04/11/16-[facility] ?ECHO - 11/17/20-[facility] ?Cardiac Cath -denies  ? ?Sleep Study - denies ?CPAP - denies ? ?Pt is pre-diabetic ? ?Blood Thinner Instructions: n/a ?Aspirin Instructions: n/a ? ?NPO at MD ? ?COVID TEST- n/a ? ?Anesthesia review: Yes, hx of heart murmur. Recent ECHO 11/17/20. ? ?Patient denies shortness of breath, fever, cough and chest pain at PAT appointment ? ? ?All instructions explained to the patient, with a verbal understanding of the material. Patient agrees to go over the instructions while at home for a better understanding. Patient also instructed to self quarantine after being tested for COVID-19. The opportunity to ask questions was provided. ? ? ?

## 2021-10-04 ENCOUNTER — Encounter (HOSPITAL_COMMUNITY): Payer: Self-pay

## 2021-10-04 NOTE — Anesthesia Preprocedure Evaluation (Addendum)
Anesthesia Evaluation  ?Patient identified by MRN, date of birth, ID band ?Patient awake ? ? ? ?Reviewed: ?Allergy & Precautions, NPO status , Patient's Chart, lab work & pertinent test results ? ?Airway ?Mallampati: II ? ?TM Distance: >3 FB ?Neck ROM: Full ? ? ? Dental ? ?(+) Missing, Dental Advisory Given ?  ?Pulmonary ?neg pulmonary ROS,  ?  ?Pulmonary exam normal ?breath sounds clear to auscultation ? ? ? ? ? ? Cardiovascular ?hypertension, Pt. on medications ?+ Valvular Problems/Murmurs AI and AS  ?Rhythm:Regular Rate:Normal ?+ Systolic murmurs ?Echo 11/17/20 [facility]): ?Summary  ???1. Technically difficult study.  ???2. The left ventricle is normal in size with mildly increased wall  ?thickness.  ???3. The left ventricular systolic function is normal, LVEF is visually  ?estimated at 60-65%.  ???4. There is grade I diastolic dysfunction (impaired relaxation).  ???5. The aortic valve is probably bicuspid (congenitally malformed) with  ?moderately thickened leaflets with mildly reduced excursion. There is mild to moderate aortic regurgitation.  ???6. There is mild aortic valve stenosis.  ?Peak AV transvalvular velocity: ?2.8 m/s.  ???Mean gradient: 14 mmHg.  ???Doppler velocity index: 0.40.  ???Estimated aortic valve area (VTI): 1.8 cm2.  ???Estimated aortic valve area (velocity): 1.7 cm2.  ???LVOT diameter: 2.3 cm.  ???LV stroke volume index: 26.0 ml/m2.  ???7. The right ventricle is normal in size, with normal systolic function. ?  ?Neuro/Psych ?negative neurological ROS ? negative psych ROS  ? GI/Hepatic ?negative GI ROS, Neg liver ROS,   ?Endo/Other  ?negative endocrine ROS ? Renal/GU ?negative Renal ROS  ?negative genitourinary ?  ?Musculoskeletal ?negative musculoskeletal ROS ?(+)  ? Abdominal ?  ?Peds ?negative pediatric ROS ?(+)  Hematology ?negative hematology ROS ?(+)   ?Anesthesia Other Findings ? ? Reproductive/Obstetrics ?negative OB ROS ? ?  ? ? ? ? ? ? ? ? ? ? ? ? ? ?   ?  ? ? ? ? ? ? ?Anesthesia Physical ?Anesthesia Plan ? ?ASA: 3 ? ?Anesthesia Plan: General  ? ?Post-op Pain Management: Dilaudid IV  ? ?Induction: Intravenous ? ?PONV Risk Score and Plan: 2 and Ondansetron, Dexamethasone and Treatment may vary due to age or medical condition ? ?Airway Management Planned: Oral ETT ? ?Additional Equipment:  ? ?Intra-op Plan:  ? ?Post-operative Plan: Extubation in OR ? ?Informed Consent: I have reviewed the patients History and Physical, chart, labs and discussed the procedure including the risks, benefits and alternatives for the proposed anesthesia with the patient or authorized representative who has indicated his/her understanding and acceptance.  ? ? ? ?Dental advisory given ? ?Plan Discussed with: CRNA and Surgeon ? ?Anesthesia Plan Comments: (PAT note written 10/04/2021 by Myra Gianotti, PA-C. ?)  ? ? ? ? ? ?Anesthesia Quick Evaluation ? ?
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
## 2021-10-07 ENCOUNTER — Observation Stay (HOSPITAL_COMMUNITY)
Admission: RE | Admit: 2021-10-07 | Discharge: 2021-10-08 | Disposition: A | Discharge status: Home / Self Care | Payer: Medicaid Other | Attending: Neurological Surgery | Admitting: Neurological Surgery

## 2021-10-07 ENCOUNTER — Encounter (HOSPITAL_COMMUNITY)
Admission: RE | Disposition: A | Discharge status: Home / Self Care | Payer: Self-pay | Source: Home / Self Care | Attending: Neurological Surgery

## 2021-10-07 ENCOUNTER — Ambulatory Visit (HOSPITAL_COMMUNITY): Payer: Medicaid Other

## 2021-10-07 ENCOUNTER — Other Ambulatory Visit: Payer: Self-pay

## 2021-10-07 ENCOUNTER — Encounter (HOSPITAL_COMMUNITY): Payer: Self-pay | Admitting: Neurological Surgery

## 2021-10-07 ENCOUNTER — Ambulatory Visit (HOSPITAL_BASED_OUTPATIENT_CLINIC_OR_DEPARTMENT_OTHER): Payer: Medicaid Other | Admitting: Anesthesiology

## 2021-10-07 ENCOUNTER — Ambulatory Visit (HOSPITAL_COMMUNITY): Payer: Medicaid Other | Admitting: Vascular Surgery

## 2021-10-07 DIAGNOSIS — I350 Nonrheumatic aortic (valve) stenosis: Secondary | ICD-10-CM

## 2021-10-07 DIAGNOSIS — M4722 Other spondylosis with radiculopathy, cervical region: Secondary | ICD-10-CM | POA: Insufficient documentation

## 2021-10-07 DIAGNOSIS — I10 Essential (primary) hypertension: Secondary | ICD-10-CM

## 2021-10-07 DIAGNOSIS — G959 Disease of spinal cord, unspecified: Secondary | ICD-10-CM | POA: Diagnosis present

## 2021-10-07 DIAGNOSIS — Z79899 Other long term (current) drug therapy: Secondary | ICD-10-CM | POA: Diagnosis not present

## 2021-10-07 DIAGNOSIS — Z85038 Personal history of other malignant neoplasm of large intestine: Secondary | ICD-10-CM | POA: Diagnosis not present

## 2021-10-07 DIAGNOSIS — M4712 Other spondylosis with myelopathy, cervical region: Secondary | ICD-10-CM

## 2021-10-07 DIAGNOSIS — Z9049 Acquired absence of other specified parts of digestive tract: Secondary | ICD-10-CM | POA: Insufficient documentation

## 2021-10-07 DIAGNOSIS — Z7984 Long term (current) use of oral hypoglycemic drugs: Secondary | ICD-10-CM | POA: Insufficient documentation

## 2021-10-07 DIAGNOSIS — R7303 Prediabetes: Secondary | ICD-10-CM | POA: Insufficient documentation

## 2021-10-07 HISTORY — PX: ANTERIOR CERVICAL DECOMP/DISCECTOMY FUSION: SHX1161

## 2021-10-07 SURGERY — ANTERIOR CERVICAL DECOMPRESSION/DISCECTOMY FUSION 1 LEVEL
Anesthesia: General | Site: Spine Cervical

## 2021-10-07 MED ORDER — LACTATED RINGERS IV SOLN: INTRAVENOUS | Status: DC

## 2021-10-07 MED ORDER — ACETAMINOPHEN 650 MG RE SUPP: 650.0000 mg | RECTAL | Status: DC | PRN

## 2021-10-07 MED ORDER — ACETAMINOPHEN 325 MG PO TABS
650.0000 mg | ORAL_TABLET | ORAL | Status: DC | PRN
  Administered 2021-10-08 (×2): 650 mg via ORAL
  Filled 2021-10-07 (×2): qty 2

## 2021-10-07 MED ORDER — LIDOCAINE-EPINEPHRINE 1 %-1:100000 IJ SOLN
INTRAMUSCULAR | Status: AC
  Filled 2021-10-07: qty 1

## 2021-10-07 MED ORDER — BUPIVACAINE HCL (PF) 0.5 % IJ SOLN
INTRAMUSCULAR | Status: AC
  Filled 2021-10-07: qty 30

## 2021-10-07 MED ORDER — ACETAMINOPHEN 325 MG PO TABS: 650.0000 mg | ORAL_TABLET | ORAL | Status: DC | PRN

## 2021-10-07 MED ORDER — ALUM & MAG HYDROXIDE-SIMETH 200-200-20 MG/5ML PO SUSP: 30.0000 mL | Freq: Four times a day (QID) | ORAL | Status: DC | PRN

## 2021-10-07 MED ORDER — ROCURONIUM BROMIDE 10 MG/ML (PF) SYRINGE
PREFILLED_SYRINGE | INTRAVENOUS | Status: DC | PRN
  Administered 2021-10-07: 80 mg via INTRAVENOUS

## 2021-10-07 MED ORDER — OXYCODONE HCL 5 MG/5ML PO SOLN: 5.0000 mg | Freq: Once | ORAL | Status: DC | PRN

## 2021-10-07 MED ORDER — DEXAMETHASONE SODIUM PHOSPHATE 10 MG/ML IJ SOLN
INTRAMUSCULAR | Status: DC | PRN
  Administered 2021-10-07: 10 mg via INTRAVENOUS

## 2021-10-07 MED ORDER — GELATIN ABSORBABLE MT POWD
OROMUCOSAL | Status: DC | PRN
  Administered 2021-10-07: 5 mL via TOPICAL

## 2021-10-07 MED ORDER — HYDROMORPHONE HCL 1 MG/ML IJ SOLN: 0.2500 mg | INTRAMUSCULAR | Status: DC | PRN

## 2021-10-07 MED ORDER — FENTANYL CITRATE (PF) 250 MCG/5ML IJ SOLN
INTRAMUSCULAR | Status: AC
  Filled 2021-10-07: qty 5

## 2021-10-07 MED ORDER — MIDAZOLAM HCL 2 MG/2ML IJ SOLN
INTRAMUSCULAR | Status: AC
  Filled 2021-10-07: qty 2

## 2021-10-07 MED ORDER — FLEET ENEMA 7-19 GM/118ML RE ENEM: 1.0000 | ENEMA | Freq: Once | RECTAL | Status: DC | PRN

## 2021-10-07 MED ORDER — METFORMIN HCL 500 MG PO TABS
500.0000 mg | ORAL_TABLET | Freq: Every day | ORAL | Status: DC
  Administered 2021-10-08: 500 mg via ORAL
  Filled 2021-10-07: qty 1

## 2021-10-07 MED ORDER — METHOCARBAMOL 500 MG PO TABS
500.0000 mg | ORAL_TABLET | Freq: Four times a day (QID) | ORAL | Status: DC | PRN
  Administered 2021-10-07: 500 mg via ORAL
  Filled 2021-10-07: qty 1

## 2021-10-07 MED ORDER — ACETAMINOPHEN 10 MG/ML IV SOLN
INTRAVENOUS | Status: AC
  Filled 2021-10-07: qty 100

## 2021-10-07 MED ORDER — ONDANSETRON HCL 4 MG/2ML IJ SOLN: 4.0000 mg | Freq: Once | INTRAMUSCULAR | Status: DC | PRN

## 2021-10-07 MED ORDER — ACETAMINOPHEN 10 MG/ML IV SOLN
1000.0000 mg | Freq: Once | INTRAVENOUS | Status: DC | PRN
  Administered 2021-10-07: 1000 mg via INTRAVENOUS

## 2021-10-07 MED ORDER — SODIUM CHLORIDE 0.9% FLUSH: 3.0000 mL | INTRAVENOUS | Status: DC | PRN

## 2021-10-07 MED ORDER — DOCUSATE SODIUM 100 MG PO CAPS
100.0000 mg | ORAL_CAPSULE | Freq: Two times a day (BID) | ORAL | Status: DC
  Administered 2021-10-07 – 2021-10-08 (×2): 100 mg via ORAL
  Filled 2021-10-07 (×2): qty 1

## 2021-10-07 MED ORDER — ACETAMINOPHEN 500 MG PO TABS: 1000.0000 mg | ORAL_TABLET | Freq: Four times a day (QID) | ORAL | Status: DC | PRN

## 2021-10-07 MED ORDER — POLYETHYLENE GLYCOL 3350 17 G PO PACK: 17.0000 g | PACK | Freq: Every day | ORAL | Status: DC | PRN

## 2021-10-07 MED ORDER — CEFAZOLIN SODIUM-DEXTROSE 2-4 GM/100ML-% IV SOLN
2.0000 g | Freq: Three times a day (TID) | INTRAVENOUS | Status: AC
  Administered 2021-10-08 (×2): 2 g via INTRAVENOUS
  Filled 2021-10-07 (×2): qty 100

## 2021-10-07 MED ORDER — BUPIVACAINE HCL (PF) 0.5 % IJ SOLN
INTRAMUSCULAR | Status: DC | PRN
  Administered 2021-10-07: 4 mL

## 2021-10-07 MED ORDER — PHENYLEPHRINE HCL-NACL 20-0.9 MG/250ML-% IV SOLN
INTRAVENOUS | Status: DC | PRN
Start: 2021-10-07 — End: 2021-10-07
  Administered 2021-10-07: 50 ug/min via INTRAVENOUS

## 2021-10-07 MED ORDER — ONDANSETRON HCL 4 MG/2ML IJ SOLN
4.0000 mg | Freq: Four times a day (QID) | INTRAMUSCULAR | Status: DC | PRN
  Administered 2021-10-07: 4 mg via INTRAVENOUS
  Filled 2021-10-07: qty 2

## 2021-10-07 MED ORDER — LIDOCAINE-EPINEPHRINE 1 %-1:100000 IJ SOLN
INTRAMUSCULAR | Status: DC | PRN
  Administered 2021-10-07: 4 mL

## 2021-10-07 MED ORDER — ONDANSETRON HCL 4 MG PO TABS: 4.0000 mg | ORAL_TABLET | Freq: Four times a day (QID) | ORAL | Status: DC | PRN

## 2021-10-07 MED ORDER — 0.9 % SODIUM CHLORIDE (POUR BTL) OPTIME
TOPICAL | Status: DC | PRN
  Administered 2021-10-07: 1000 mL

## 2021-10-07 MED ORDER — MENTHOL 3 MG MT LOZG: 1.0000 | LOZENGE | OROMUCOSAL | Status: DC | PRN

## 2021-10-07 MED ORDER — CHLORHEXIDINE GLUCONATE 0.12 % MT SOLN
15.0000 mL | Freq: Once | OROMUCOSAL | Status: AC
  Administered 2021-10-07: 15 mL via OROMUCOSAL
  Filled 2021-10-07: qty 15

## 2021-10-07 MED ORDER — LIDOCAINE 2% (20 MG/ML) 5 ML SYRINGE
INTRAMUSCULAR | Status: DC | PRN
  Administered 2021-10-07: 100 mg via INTRAVENOUS

## 2021-10-07 MED ORDER — PHENOL 1.4 % MT LIQD: 1.0000 | OROMUCOSAL | Status: DC | PRN

## 2021-10-07 MED ORDER — CHLORHEXIDINE GLUCONATE CLOTH 2 % EX PADS: 6.0000 | MEDICATED_PAD | Freq: Once | CUTANEOUS | Status: DC

## 2021-10-07 MED ORDER — ONDANSETRON HCL 4 MG/2ML IJ SOLN
INTRAMUSCULAR | Status: DC | PRN
Start: 2021-10-07 — End: 2021-10-07
  Administered 2021-10-07: 4 mg via INTRAVENOUS

## 2021-10-07 MED ORDER — SENNA 8.6 MG PO TABS
1.0000 | ORAL_TABLET | Freq: Two times a day (BID) | ORAL | Status: DC
  Administered 2021-10-07 – 2021-10-08 (×2): 8.6 mg via ORAL
  Filled 2021-10-07 (×2): qty 1

## 2021-10-07 MED ORDER — MIDAZOLAM HCL 2 MG/2ML IJ SOLN
INTRAMUSCULAR | Status: DC | PRN
  Administered 2021-10-07: 2 mg via INTRAVENOUS

## 2021-10-07 MED ORDER — AMLODIPINE BESYLATE 5 MG PO TABS
5.0000 mg | ORAL_TABLET | Freq: Every day | ORAL | Status: DC
  Administered 2021-10-08: 5 mg via ORAL
  Filled 2021-10-07: qty 1

## 2021-10-07 MED ORDER — PROPOFOL 10 MG/ML IV BOLUS
INTRAVENOUS | Status: AC
  Filled 2021-10-07: qty 20

## 2021-10-07 MED ORDER — FENTANYL CITRATE (PF) 250 MCG/5ML IJ SOLN
INTRAMUSCULAR | Status: DC | PRN
  Administered 2021-10-07 (×2): 50 ug via INTRAVENOUS
  Administered 2021-10-07: 100 ug via INTRAVENOUS

## 2021-10-07 MED ORDER — THROMBIN 5000 UNITS EX SOLR
CUTANEOUS | Status: AC
  Filled 2021-10-07: qty 5000

## 2021-10-07 MED ORDER — BISACODYL 10 MG RE SUPP: 10.0000 mg | Freq: Every day | RECTAL | Status: DC | PRN

## 2021-10-07 MED ORDER — METHOCARBAMOL 1000 MG/10ML IJ SOLN
500.0000 mg | Freq: Four times a day (QID) | INTRAVENOUS | Status: DC | PRN
  Filled 2021-10-07: qty 5

## 2021-10-07 MED ORDER — ORAL CARE MOUTH RINSE: 15.0000 mL | Freq: Once | OROMUCOSAL | Status: AC

## 2021-10-07 MED ORDER — SODIUM CHLORIDE 0.9% FLUSH
3.0000 mL | Freq: Two times a day (BID) | INTRAVENOUS | Status: DC
  Administered 2021-10-08: 3 mL via INTRAVENOUS

## 2021-10-07 MED ORDER — OXYCODONE HCL 5 MG PO TABS: 5.0000 mg | ORAL_TABLET | Freq: Once | ORAL | Status: DC | PRN

## 2021-10-07 MED ORDER — PROPOFOL 500 MG/50ML IV EMUL
INTRAVENOUS | Status: DC | PRN
  Administered 2021-10-07: 75 ug/kg/min via INTRAVENOUS

## 2021-10-07 MED ORDER — MORPHINE SULFATE (PF) 2 MG/ML IV SOLN: 2.0000 mg | INTRAVENOUS | Status: DC | PRN

## 2021-10-07 MED ORDER — OXYCODONE HCL 5 MG PO TABS
5.0000 mg | ORAL_TABLET | ORAL | Status: DC | PRN
  Administered 2021-10-07: 5 mg via ORAL
  Filled 2021-10-07: qty 1

## 2021-10-07 MED ORDER — CEFAZOLIN SODIUM-DEXTROSE 2-4 GM/100ML-% IV SOLN
2.0000 g | INTRAVENOUS | Status: AC
  Administered 2021-10-07: 2 g via INTRAVENOUS
  Filled 2021-10-07: qty 100

## 2021-10-07 MED ORDER — PROPOFOL 10 MG/ML IV BOLUS
INTRAVENOUS | Status: DC | PRN
  Administered 2021-10-07: 150 mg via INTRAVENOUS

## 2021-10-07 MED ORDER — SODIUM CHLORIDE 0.9 % IV SOLN: 250.0000 mL | INTRAVENOUS | Status: DC

## 2021-10-07 MED ORDER — SUGAMMADEX SODIUM 200 MG/2ML IV SOLN
INTRAVENOUS | Status: DC | PRN
  Administered 2021-10-07: 200 mg via INTRAVENOUS

## 2021-10-07 SURGICAL SUPPLY — 47 items
BAG COUNTER SPONGE SURGICOUNT (BAG) ×2 IMPLANT
BAND RUBBER #18 3X1/16 STRL (MISCELLANEOUS) IMPLANT
BIT DRILL ACP 15 (DRILL) IMPLANT
BIT DRILL NEURO 2X3.1 SFT TUCH (MISCELLANEOUS) ×1 IMPLANT
BNDG GAUZE ELAST 4 BULKY (GAUZE/BANDAGES/DRESSINGS) IMPLANT
BUR BARREL STRAIGHT FLUTE 4.0 (BURR) IMPLANT
CAGE CERV MOD 7X17X14 7D (Cage) ×1 IMPLANT
CANISTER SUCT 3000ML PPV (MISCELLANEOUS) ×2 IMPLANT
DECANTER SPIKE VIAL GLASS SM (MISCELLANEOUS) ×2 IMPLANT
DERMABOND ADVANCED (GAUZE/BANDAGES/DRESSINGS) ×1
DERMABOND ADVANCED .7 DNX12 (GAUZE/BANDAGES/DRESSINGS) ×1 IMPLANT
DRAPE LAPAROTOMY 100X72 PEDS (DRAPES) ×2 IMPLANT
DRAPE MICROSCOPE LEICA (MISCELLANEOUS) IMPLANT
DRILL ACP 15 (DRILL) ×2
DRILL NEURO 2X3.1 SOFT TOUCH (MISCELLANEOUS) ×2
DURAPREP 6ML APPLICATOR 50/CS (WOUND CARE) ×2 IMPLANT
ELECT COATED BLADE 2.86 ST (ELECTRODE) ×2 IMPLANT
ELECT REM PT RETURN 9FT ADLT (ELECTROSURGICAL) ×2
ELECTRODE REM PT RTRN 9FT ADLT (ELECTROSURGICAL) ×1 IMPLANT
GAUZE 4X4 16PLY ~~LOC~~+RFID DBL (SPONGE) ×1 IMPLANT
GLOVE SURG LTX SZ8.5 (GLOVE) ×2 IMPLANT
GLOVE SURG UNDER POLY LF SZ8.5 (GLOVE) ×2 IMPLANT
GOWN STRL REUS W/ TWL LRG LVL3 (GOWN DISPOSABLE) IMPLANT
GOWN STRL REUS W/ TWL XL LVL3 (GOWN DISPOSABLE) ×1 IMPLANT
GOWN STRL REUS W/TWL 2XL LVL3 (GOWN DISPOSABLE) ×2 IMPLANT
GOWN STRL REUS W/TWL LRG LVL3 (GOWN DISPOSABLE)
GOWN STRL REUS W/TWL XL LVL3 (GOWN DISPOSABLE) ×1
HALTER HD/CHIN CERV TRACTION D (MISCELLANEOUS) ×2 IMPLANT
HEMOSTAT POWDER KIT SURGIFOAM (HEMOSTASIS) ×2 IMPLANT
KIT BASIN OR (CUSTOM PROCEDURE TRAY) ×2 IMPLANT
NDL SPNL 22GX3.5 QUINCKE BK (NEEDLE) ×1 IMPLANT
NEEDLE HYPO 22GX1.5 SAFETY (NEEDLE) ×2 IMPLANT
NEEDLE SPNL 22GX3.5 QUINCKE BK (NEEDLE) ×2 IMPLANT
NS IRRIG 1000ML POUR BTL (IV SOLUTION) ×2 IMPLANT
PACK LAMINECTOMY NEURO (CUSTOM PROCEDURE TRAY) ×2 IMPLANT
PAD ARMBOARD 7.5X6 YLW CONV (MISCELLANEOUS) ×6 IMPLANT
PATTIES SURGICAL .5 X1 (DISPOSABLE) ×2 IMPLANT
PLATE ACP 1-LEVEL 1.6V20 (Plate) ×1 IMPLANT
PUTTY DBM PROPEL SM (Putty) ×1 IMPLANT
SCREW ACP VA ST 3.5X15 (Screw) ×4 IMPLANT
SET WALTER ACTIVATION W/DRAPE (SET/KITS/TRAYS/PACK) ×2 IMPLANT
SPONGE INTESTINAL PEANUT (DISPOSABLE) ×2 IMPLANT
SPONGE T-LAP 4X18 ~~LOC~~+RFID (SPONGE) ×1 IMPLANT
SUT VIC AB 4-0 RB1 18 (SUTURE) ×3 IMPLANT
TOWEL GREEN STERILE (TOWEL DISPOSABLE) ×2 IMPLANT
TOWEL GREEN STERILE FF (TOWEL DISPOSABLE) ×2 IMPLANT
WATER STERILE IRR 1000ML POUR (IV SOLUTION) ×2 IMPLANT

## 2021-10-07 NOTE — Op Note (Signed)
Date of surgery: 10/07/2021 ?Preoperative diagnosis: Cervical spondylosis with myelopathy C3-C4, cervical radiculopathy. ?Postoperative diagnosis: Same ?Procedure: Anterior cervical decompression arthrodesis with structural spacer allograft anterior plate fixation ?Surgeon: Kristeen Miss ?Anesthesia: General endotracheal ?Indications: Don Rios is a 56 year old individual who has had significant issues of neck pain shoulder dysesthesias and cervical radiculopathy and MRI demonstrated presence of a significant stenosis at the level of C3-C4 above his previous C4-C7 fusion.  He was advised regarding the need for surgical decompression arthrodesis. ?Procedure: The patient was brought to the operating room placed on the table in supine position.  After the smooth induction of general endotracheal anesthesia, he was carefully placed in 5 pounds of halter traction the neck was prepped with alcohol and DuraPrep and draped in a sterile fashion.  An incision was made in the neck on the left side in the transverse plane approximately 1-1/2 cm above his previous incision.  Dissection was taken down through the platysma the plane between the sternocleidomastoid and the strap muscles was dissected bluntly until the prevertebral space could be reached.  Care was taken to identify the lateral border of the plate and at this area dissection more medially was undertaken.  Soft tissues were released and the esophagus was carefully dissected off of the ventral aspect of the vertebral bodies at the C4 and C3 levels.  The superior aspect of the previously placed plate was uncovered.  There there was a significant amount of soft tissue overgrown in this region and the area of the C3-4 disc space was uncovered.  This space was entered with a 15 blade and a combination of curettes and rongeurs was used to evacuate a substantial quantity of significantly degenerated disc material the region of the posterior endplates was then cleared and a  bony spur was noted posteriorly this was drilled down with a 2 mm high-speed drill bit.  Ultimately the posterior longitudinal ligament was opened and the area was decompressed from side to side including into the regions of the foramen hemostasis was then achieved with some Surgifoam and the interspace was sized for an appropriate size spacer it is felt that a 7 x 17 x 14 mm modulus C spacer with 7 degrees lordosis would fit best with this was then filled with demineralized bone matrix and placed into the interval.  Ventral osteophytes were then drilled down and a 20 mm ACP plate was then fixed to the ventral aspect of the vertebral bodies and secured with four 3-1/2 x 15 mm screws.  Final radiograph was obtained identifying good position of the hardware the soft tissues were checked for hemostasis and when this was verified the platysma was closed with 4-0 Vicryl in interrupted fashion and 4-0 Vicryl was used in subcuticular tissues Dermabond was placed on the skin. blood loss for the procedure was estimated at less than 100 cc.  Patient was returned to recovery room in stable condition. ?

## 2021-10-07 NOTE — Anesthesia Postprocedure Evaluation (Signed)
Anesthesia Post Note ? ?Patient: Don Rios ? ?Procedure(s) Performed: Anterior Cervical Decompression Fusion Cervical three-four (Spine Cervical) ? ?  ? ?Patient location during evaluation: PACU ?Anesthesia Type: General ?Level of consciousness: awake and alert ?Pain management: pain level controlled ?Vital Signs Assessment: post-procedure vital signs reviewed and stable ?Respiratory status: spontaneous breathing, nonlabored ventilation and respiratory function stable ?Cardiovascular status: blood pressure returned to baseline and stable ?Postop Assessment: no apparent nausea or vomiting ?Anesthetic complications: no ? ? ?No notable events documented. ? ?Last Vitals:  ?Vitals:  ? 10/07/21 1945 10/07/21 2000  ?BP: 129/79 130/70  ?Pulse: 74 64  ?Resp: 14 12  ?Temp:    ?SpO2: 94% 95%  ?  ?Last Pain:  ?Vitals:  ? 10/07/21 1930  ?TempSrc:   ?PainSc: 3   ? ? ?  ?  ?  ?  ?  ?  ? ?Lovada Barwick,W. EDMOND ? ? ? ? ?

## 2021-10-07 NOTE — Transfer of Care (Signed)
Immediate Anesthesia Transfer of Care Note ? ?Patient: JADORE VEALS ? ?Procedure(s) Performed: Anterior Cervical Decompression Fusion Cervical three-four (Spine Cervical) ? ?Patient Location: PACU ? ?Anesthesia Type:General ? ?Level of Consciousness: awake and alert  ? ?Airway & Oxygen Therapy: Patient Spontanous Breathing and Patient connected to nasal cannula oxygen ? ?Post-op Assessment: Report given to RN and Post -op Vital signs reviewed and stable ? ?Post vital signs: Reviewed and stable ? ?Last Vitals:  ?Vitals Value Taken Time  ?BP 127/70 10/07/21 1900  ?Temp 36.9 ?C 10/07/21 1900  ?Pulse 85 10/07/21 1901  ?Resp 15 10/07/21 1901  ?SpO2 96 % 10/07/21 1901  ?Vitals shown include unvalidated device data. ? ?Last Pain:  ?Vitals:  ? 10/07/21 1439  ?TempSrc:   ?PainSc: 9   ?   ? ?  ? ?Complications: No notable events documented. ?

## 2021-10-07 NOTE — H&P (Signed)
Don Rios is an 56 y.o. male.   ?Chief Complaint: Bilateral neck and shoulder pain. ?HPI: Don Rios is a 56 year old individual who has had previous facet fracture at the C5-6 level has undergone surgical decompression and stabilization at C4-5 C5-6 and C6-C7.  His last surgery was back in 2021.  He did well for period of time but has had increasing neck and shoulder pain with some left-sided upper cervical radicular pain.  This radiates into the shoulder and over the region of the deltoid.  More recent MRI studies demonstrate that the patient had advanced spondylitic changes at the C3-C4 level with a moderate degree of stenosis and bilateral lateral recess stenosis.  He has been advised regarding anterior cervical decompression and arthrodesis at C3-4.  He is now being admitted for this procedure. ? ?Past Medical History:  ?Diagnosis Date  ? Arthritis   ? Cancer [facility])   ? Colon  ? Chronic back pain   ? GERD (gastroesophageal reflux disease)   ? Headache   ? Heart murmur 11/17/2020  ? possible bicuspid AV, mild AS, mild-moderate AI 11/17/20 echo  ? Hypertension   ? Pneumonia   ? ? as a child  ? Pre-diabetes   ? ? ?Past Surgical History:  ?Procedure Laterality Date  ? ANTERIOR CERVICAL DECOMP/DISCECTOMY FUSION N/A 05/11/2020  ? Procedure: Cervical four-five Cervical five-six Cervical six-seven Anterior cervical decompression/discectomy/fusion;  Surgeon: Kristeen Miss, MD;  Location: [facility];  Service: Neurosurgery;  Laterality: N/A;  3C/RM 20  ? arm ORIF Right 3-4 yrs ago  ? arm surgery    ? COLECTOMY  05/04/2021  ? laparoscopic assisted sigmoidectomy, 2022  ? TONSILLECTOMY    ? as a child  ? ? ?History reviewed. No pertinent family history. ?Social History:  reports that he has never smoked. He has never used smokeless tobacco. He reports current alcohol use. He reports that he does not use drugs. ? ?Allergies: No Known Allergies ? ?Medications Prior to Admission  ?Medication Sig Dispense Refill  ? acetaminophen  (TYLENOL) 500 MG tablet Take 1,000 mg by mouth every 6 (six) hours as needed for mild pain or headache.    ? amLODipine (NORVASC) 5 MG tablet Take 5 mg by mouth daily.    ? metFORMIN (GLUCOPHAGE) 500 MG tablet Take 500 mg by mouth daily.    ? ? ?No results found for this or any previous visit (from the past 48 hour(s)). ?No results found. ? ?Review of Systems  ?Constitutional:  Positive for activity change.  ?HENT: Negative.    ?Respiratory: Negative.    ?Cardiovascular: Negative.   ?Gastrointestinal: Negative.   ?Genitourinary: Negative.   ?Musculoskeletal:  Positive for neck pain.  ?Skin: Negative.   ?Neurological:  Positive for numbness.  ?Hematological: Negative.   ?Psychiatric/Behavioral: Negative.    ?All other systems reviewed and are negative. ? ?Blood pressure (!) 116/50, pulse 87, temperature 97.8 ?F (36.6 ?C), temperature source Oral, resp. rate 18, height '5\' 9"'$  (1.753 m), weight 101.2 kg, SpO2 97 %. ?Physical Exam ?Constitutional:   ?   Appearance: Normal appearance.  ?HENT:  ?   Head: Normocephalic and atraumatic.  ?   Right Ear: Tympanic membrane normal.  ?   Left Ear: Tympanic membrane normal.  ?   Nose: Nose normal.  ?   Mouth/Throat:  ?   Mouth: Mucous membranes are moist.  ?Eyes:  ?   Extraocular Movements: Extraocular movements intact.  ?   Pupils: Pupils are equal, round, and reactive to light.  ?  Neck:  ?   Comments: Decreased range of motion to 30 degrees in either extreme.  Flexion extension limited to 50% of normal. ?Cardiovascular:  ?   Rate and Rhythm: Normal rate and regular rhythm.  ?   Pulses: Normal pulses.  ?   Heart sounds: Normal heart sounds.  ?Pulmonary:  ?   Effort: Pulmonary effort is normal.  ?   Breath sounds: Normal breath sounds.  ?Abdominal:  ?   General: Abdomen is flat. Bowel sounds are normal.  ?   Palpations: Abdomen is soft.  ?Musculoskeletal:     ?   General: Normal range of motion.  ?Skin: ?   General: Skin is warm and dry.  ?   Capillary Refill: Capillary refill takes  less than 2 seconds.  ?Neurological:  ?   Mental Status: He is alert.  ?   Comments: Alert and oriented.  Speech is fluent.  Thought and composition is normal.  Cranial nerves reveal 3 mm pupils briskly reactive light accommodation extraocular movements are full face symmetric to grimace tongue and uvula are midline sclera conjunctiva are clear upper extremity strength reveals good strength in the deltoids biceps triceps grips and intrinsics with normal tone and bulk absent reflexes are noted in the biceps on the left trace bicep reflex on the right patellar reflexes are 2+ Achilles reflexes are 2+ Babinski's are downgoing sensation appears diminished over the region of the deltoids on both sides but otherwise appears intact to pin light touch and vibratory sensation in the upper and lower extremities.  Station and gait are intact  ?Psychiatric:     ?   Mood and Affect: Mood normal.     ?   Behavior: Behavior normal.     ?   Thought Content: Thought content normal.     ?   Judgment: Judgment normal.  ?  ? ?Assessment/Plan ?Spondylosis C3-4 with cervical radiculopathy. ? ?Plan: Anterior cervical decompression C3-C4.  History of fusion C4-C7. ? ?[name], MD ?10/07/2021, 3:06 PM ? ? ? ?

## 2021-10-07 NOTE — Anesthesia Procedure Notes (Signed)
Procedure Name: Intubation ?Date/Time: 10/07/2021 5:10 PM ?Performed by: Minerva Ends, CRNA ?Pre-anesthesia Checklist: Patient identified, Emergency Drugs available, Suction available and Patient being monitored ?Patient Re-evaluated:Patient Re-evaluated prior to induction ?Oxygen Delivery Method: Circle system utilized ?Preoxygenation: Pre-oxygenation with 100% oxygen ?Induction Type: IV induction ?Ventilation: Mask ventilation without difficulty ?Laryngoscope Size: Mac, Glidescope and 3 ?Grade View: Grade I ?Tube type: Oral ?Tube size: 7.0 mm ?Number of attempts: 1 ?Airway Equipment and Method: Stylet and Oral airway ?Placement Confirmation: ETT inserted through vocal cords under direct vision, positive ETCO2 and breath sounds checked- equal and bilateral ?Secured at: 23 cm ?Tube secured with: Tape ?Dental Injury: Teeth and Oropharynx as per pre-operative assessment  ? ? ? ? ?

## 2021-10-07 NOTE — Progress Notes (Signed)
Patient ID: Don Rios, male   DOB: April 17, 1966, 56 y.o.   MRN: [id number] ?Patient is awake alert and motor function is stable.  Notes his hands feel less numb.  He is able to swallow liquids okay.  We will watch him overnight and plan discharge in the morning ?

## 2021-10-08 ENCOUNTER — Encounter (HOSPITAL_COMMUNITY): Payer: Self-pay | Admitting: Neurological Surgery

## 2021-10-08 DIAGNOSIS — M4712 Other spondylosis with myelopathy, cervical region: Secondary | ICD-10-CM | POA: Diagnosis not present

## 2021-10-08 MED ORDER — TRAMADOL HCL 50 MG PO TABS: 50.0000 mg | ORAL_TABLET | Freq: Four times a day (QID) | ORAL | 0 refills | Status: AC | PRN

## 2021-10-08 MED ORDER — METHOCARBAMOL 500 MG PO TABS: 500.0000 mg | ORAL_TABLET | Freq: Four times a day (QID) | ORAL | 2 refills | Status: AC | PRN

## 2021-10-08 NOTE — Plan of Care (Signed)
?  Problem: Education: Goal: Ability to verbalize activity precautions or restrictions will improve Outcome: Completed/Met Goal: Knowledge of the prescribed therapeutic regimen will improve Outcome: Completed/Met Goal: Understanding of discharge needs will improve Outcome: Completed/Met   Problem: Activity: Goal: Ability to avoid complications of mobility impairment will improve Outcome: Completed/Met Goal: Ability to tolerate increased activity will improve Outcome: Completed/Met Goal: Will remain free from falls Outcome: Completed/Met   Problem: Bowel/Gastric: Goal: Gastrointestinal status for postoperative course will improve Outcome: Completed/Met   Problem: Clinical Measurements: Goal: Ability to maintain clinical measurements within normal limits will improve Outcome: Completed/Met Goal: Postoperative complications will be avoided or minimized Outcome: Completed/Met Goal: Diagnostic test results will improve Outcome: Completed/Met   Problem: Pain Management: Goal: Pain level will decrease Outcome: Completed/Met   Problem: Skin Integrity: Goal: Will show signs of wound healing Outcome: Completed/Met   Problem: Health Behavior/Discharge Planning: Goal: Identification of resources available to assist in meeting health care needs will improve Outcome: Completed/Met   Problem: Bladder/Genitourinary: Goal: Urinary functional status for postoperative course will improve Outcome: Completed/Met   

## 2021-10-08 NOTE — Progress Notes (Signed)
Patient ambulated with wife and RN to his transport vehicle for discharge home; in no acute distress nor complaints of pain nor discomfort; incision on his anterior neck with skin glue and is clean, dry and intact; room was checked for his belongings; discharge instructions concerning his medications, incision care, follow up appointment and when to call the doctor as needed were all discussed with patient and his wife by RN and they verbalized understanding on the instructions given. ?

## 2021-10-08 NOTE — Discharge Summary (Addendum)
Physician Discharge Summary  ?Patient ID: ?Don Rios ?MRN: [id number] ?DOB/AGE: 08/16/1965 56 y.o. ? ?Admit date: 10/07/2021 ?Discharge date: 10/08/2021 ? ?Admission Diagnoses:Spondylosis with myelopathy and radiculopathy C3-4 ? ?Discharge Diagnoses: Spondylosis with myelopathy and radiculopathy C3-4 ?Principal Problem: ?  Cervical myelopathy ([place]) ? ? ?Discharged Condition: good ? ?Hospital Course: Tolerated surgery well ? ?Consults: None ? ?Significant Diagnostic Studies: none ? ?Treatments: surgery: see op note ? ?Discharge Exam: ?Blood pressure 116/68, pulse 80, temperature 98.2 ?F (36.8 ?C), resp. rate 16, height '5\' 9"'$  (1.753 m), weight 101.2 kg, SpO2 94 %. ?Incision clean and dry, swallow ok, motoer and sensory fn ok ? ?Disposition: Discharge disposition: 01-Home or Self Care ? ? ? ? ? ? ?Discharge Instructions   ? ? Call MD for:  redness, tenderness, or signs of infection (pain, swelling, redness, odor or green/yellow discharge around incision site)   Complete by: As directed ?  ? Call MD for:  severe uncontrolled pain   Complete by: As directed ?  ? Call MD for:  temperature >100.4   Complete by: As directed ?  ? Diet - low sodium heart healthy   Complete by: As directed ?  ? Discharge wound care:   Complete by: As directed ?  ? Okay to shower. Do not apply salves or appointments to incision. No heavy lifting with the upper extremities greater than 10 pounds. May resume driving when not requiring pain medication and patient feels comfortable with doing so.  ? Incentive spirometry RT   Complete by: As directed ?  ? Increase activity slowly   Complete by: As directed ?  ? ?  ? ? ? ? ? ?Signed: ?[name] ?10/08/2021, 2:20 PM ? ? ?

## 2021-10-08 NOTE — Progress Notes (Signed)
PT Cancellation Note ? ?Patient Details ?Name: Don Rios ?MRN: [id number] ?DOB: 1966-01-15 ? ? ?Cancelled Treatment:    Reason Eval/Treat Not Completed: PT screened, no needs identified, will sign off Patient independent per OT with no concerns for mobility. PT will sign off.  ? ?Maguadalupe Lata A. Gilford Rile, PT, DPT ?Acute Rehabilitation Services ?Pager [phone] ?Office [phone] ? ? ? ?Don Rios ?10/08/2021, 8:01 AM ?

## 2021-10-08 NOTE — Evaluation (Signed)
Occupational Therapy Evaluation ?Patient Details ?Name: Don Rios ?MRN: [id number] ?DOB: 29-Jul-1965 ?Today's Date: 10/08/2021 ? ? ?History of Present Illness 56 yo male s/p ACDF C3-4. PMH including arthritis, cancer, HTN, and ACDF (2021)  ? ?Clinical Impression ?  ?PTA, pt was living with his wife and was independent. Currently, pt performing ADLs and functional mobility at Mod I level. Provided education and handout on LB ADLs, toilet transfer, and tub transfer; pt demonstrated understanding. Answered all pt questions. Recommend dc home once medically stable per physician. All acute OT needs met and will sign off. Thank you.  ? ?Recommendations for follow up therapy are one component of a multi-disciplinary discharge planning process, led by the attending physician.  Recommendations may be updated based on patient status, additional functional criteria and insurance authorization.  ? ?Follow Up Recommendations ? No OT follow up  ?  ?Assistance Recommended at Discharge PRN  ?Patient can return home with the following   ? ?  ?Functional Status Assessment ?    ?Equipment Recommendations ? None recommended by OT  ?  ?Recommendations for Other Services   ? ? ?  ?Precautions / Restrictions Precautions ?Precautions: Cervical ?Precaution Booklet Issued: Yes (comment) ?Precaution Comments: Reviewed education on cervical precautions and adaptive techniques ?Required Braces or Orthoses: Other Brace ?Other Brace: No brace per MD  ? ?  ? ?Mobility Bed Mobility ?Overal bed mobility: Modified Independent ?  ?  ?  ?  ?  ?  ?  ?  ? ?Transfers ?Overall transfer level: Independent ?  ?  ?  ?  ?  ?  ?  ?  ?  ?  ? ?  ?Balance Overall balance assessment: No apparent balance deficits (not formally assessed) ?  ?  ?  ?  ?  ?  ?  ?  ?  ?  ?  ?  ?  ?  ?  ?  ?  ?  ?   ? ?ADL either performed or assessed with clinical judgement  ? ?ADL Overall ADL's : Modified independent ?  ?  ?  ?  ?  ?  ?  ?  ?  ?  ?  ?  ?  ?  ?  ?  ?  ?  ?  ?General ADL  Comments: Pt performing ADLs at Mod I level. Providing education on cervical precautions, bed mobility, UB ADLs, LB ADLS, grooming, toileting, and tub transfer. Also practicing stair training  ? ? ? ?Vision   ?   ?   ?Perception   ?  ?Praxis   ?  ? ?Pertinent Vitals/Pain Pain Assessment ?Pain Assessment: Faces ?Faces Pain Scale: Hurts little more ?Pain Location: Neck ?Pain Descriptors / Indicators: Constant, Discomfort ?Pain Intervention(s): Monitored during session, Limited activity within patient's tolerance, Repositioned  ? ? ? ?Hand Dominance Right ?  ?Extremity/Trunk Assessment Upper Extremity Assessment ?Upper Extremity Assessment: Overall WFL for tasks assessed;RUE deficits/detail ?RUE Deficits / Details: R hand weaker than L. No more numbness in R hand ?  ?Lower Extremity Assessment ?Lower Extremity Assessment: Overall WFL for tasks assessed ?  ?Cervical / Trunk Assessment ?Cervical / Trunk Assessment: Neck Surgery ?  ?Communication Communication ?Communication: No difficulties ?  ?Cognition Arousal/Alertness: Awake/alert ?Behavior During Therapy: [facility] for tasks assessed/performed ?Overall Cognitive Status: Within Functional Limits for tasks assessed ?  ?  ?  ?  ?  ?  ?  ?  ?  ?  ?  ?  ?  ?  ?  ?  ?  General Comments: Cues for maintaining cervical precautions ?  ?  ?General Comments    ? ?  ?Exercises   ?  ?Shoulder Instructions    ? ? ?Home Living Family/patient expects to be discharged to:: Private residence ?Living Arrangements: Spouse/significant other ?Available Help at Discharge: Family;Available 24 hours/day ?Type of Home: Mobile home ?Home Access: Stairs to enter ?Entrance Stairs-Number of Steps: 5 ?Entrance Stairs-Rails: Can reach both ?Home Layout: One level ?  ?  ?Bathroom Shower/Tub: Tub/shower unit ?  ?Bathroom Toilet: Standard ?  ?  ?Home Equipment: Kasandra Knudsen - single point ?  ?  ?  ? ?  ?Prior Functioning/Environment Prior Level of Function : Independent/Modified Independent ?  ?  ?  ?  ?  ?  ?  ?  ?   ? ?  ?  ?OT Problem List: Decreased activity tolerance;Decreased range of motion ?  ?   ?OT Treatment/Interventions:    ?  ?OT Goals(Current goals can be found in the care plan section) Acute Rehab OT Goals ?Patient Stated Goal: Go home ?OT Goal Formulation: All assessment and education complete, DC therapy  ?OT Frequency:   ?  ? ?Co-evaluation   ?  ?  ?  ?  ? ?  ?AM-PAC OT "6 Clicks" Daily Activity     ?Outcome Measure Help from another person eating meals?: None ?Help from another person taking care of personal grooming?: None ?Help from another person toileting, which includes using toliet, bedpan, or urinal?: None ?Help from another person bathing (including washing, rinsing, drying)?: None ?Help from another person to put on and taking off regular upper body clothing?: None ?Help from another person to put on and taking off regular lower body clothing?: None ?6 Click Score: 24 ?  ?End of Session Nurse Communication: Mobility status ? ?Activity Tolerance: Patient tolerated treatment well ?Patient left: in bed;with call bell/phone within reach ? ?OT Visit Diagnosis: Unsteadiness on feet (R26.81);Other abnormalities of gait and mobility (R26.89);Muscle weakness (generalized) (M62.81)  ?              ?Time: 6606-3016 ?OT Time Calculation (min): 15 min ?Charges:  OT General Charges ?$OT Visit: 1 Visit ?OT Evaluation ?$OT Eval Low Complexity: 1 Low ? ?Don Rios MSOT, OTR/L ?Acute Rehab ?Pager: [phone] ?Office: [phone] ? ?Briann Sarchet M Shawnelle Spoerl ?10/08/2021, 8:56 AM ?

## 2022-01-20 ENCOUNTER — Other Ambulatory Visit (HOSPITAL_COMMUNITY): Payer: Self-pay

## 2023-07-02 IMAGING — CT CT ANGIO CHEST
2 of 7 series · 13 of 36 positions shown · IV contrast (Omnipaque or Isovue)
Comparison: 10/17/2019

CLINICAL DATA: Bicuspid aortic valve, aortic stenosis/regurgitation
assess for coexisting aortic disease

EXAM:
CT ANGIOGRAPHY CHEST WITH CONTRAST
TECHNIQUE: Multidetector CT imaging of the chest was performed using the
standard protocol during bolus administration of intravenous
contrast. Multiplanar CT image reconstructions and MIPs were
obtained to evaluate the vascular anatomy.
CONTRAST:  75mL OMNIPAQUE IOHEXOL 350 MG/ML SOLN

[Series 8: lungs · axial · 0.75mm/px · z∈[+941,+1205]mm · 12 of 157 slices shown]
[im 13/157  lung]
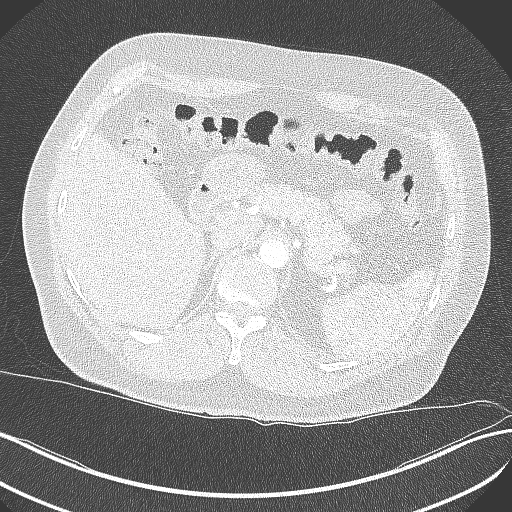
[im 25/157  mediastinal]
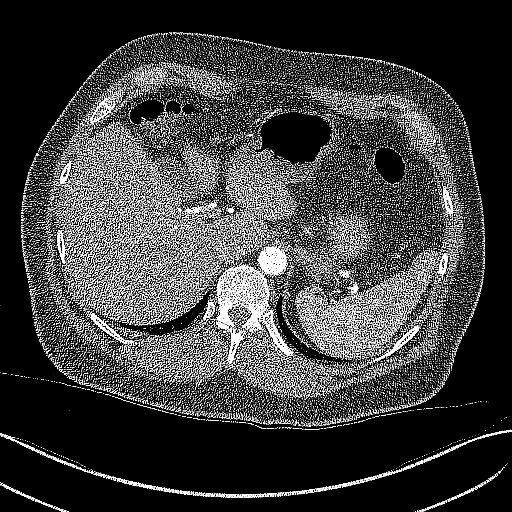
[im 37/157  lung]
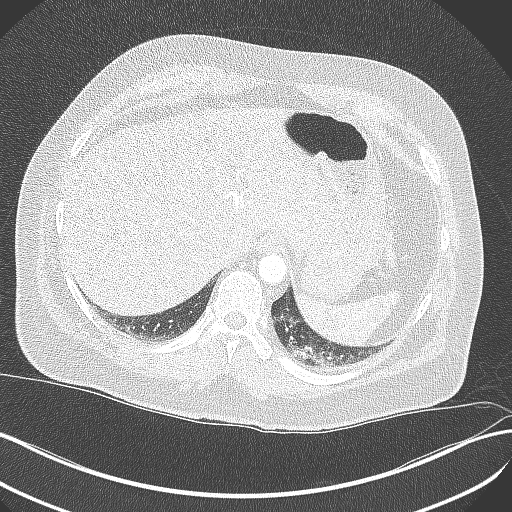
[im 49/157  mediastinal]
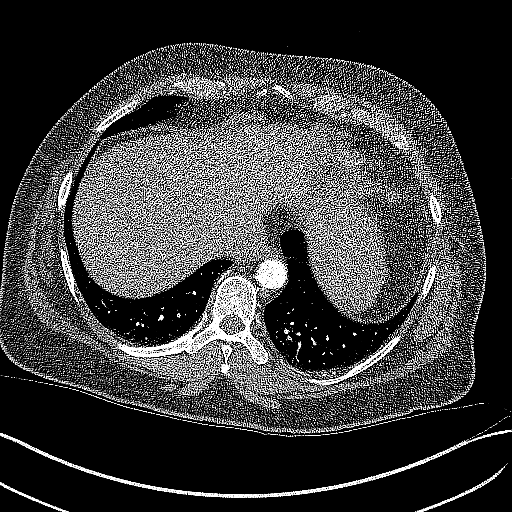
[im 61/157  lung]
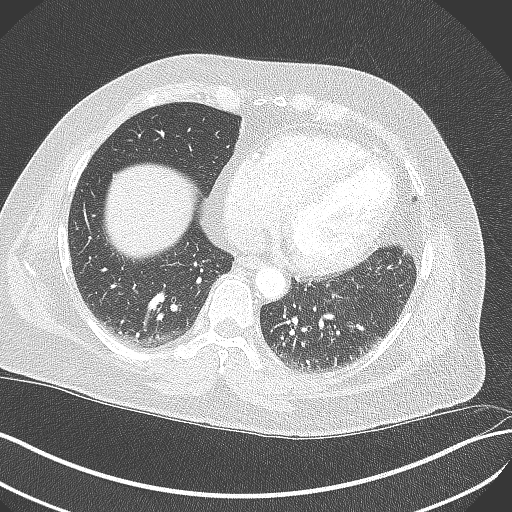
[im 73/157  mediastinal]
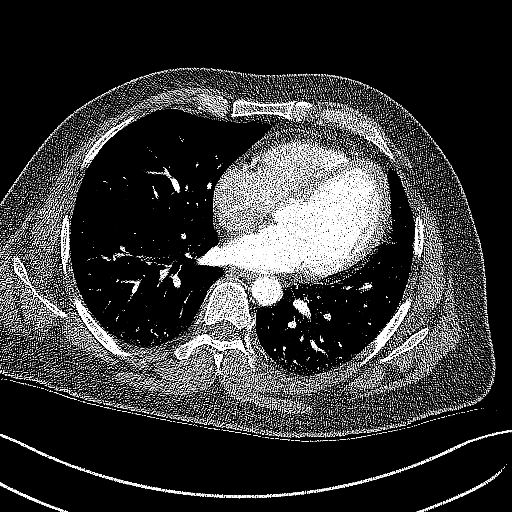
[im 85/157  lung]
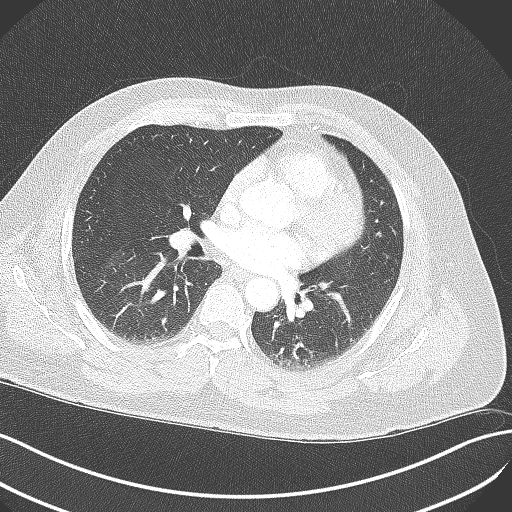
[im 97/157  mediastinal]
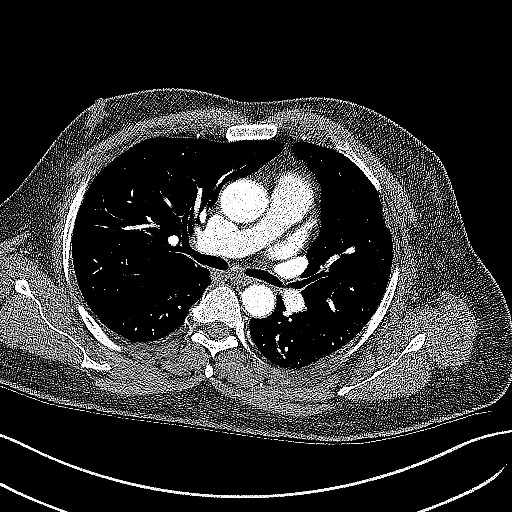
[im 109/157  lung]
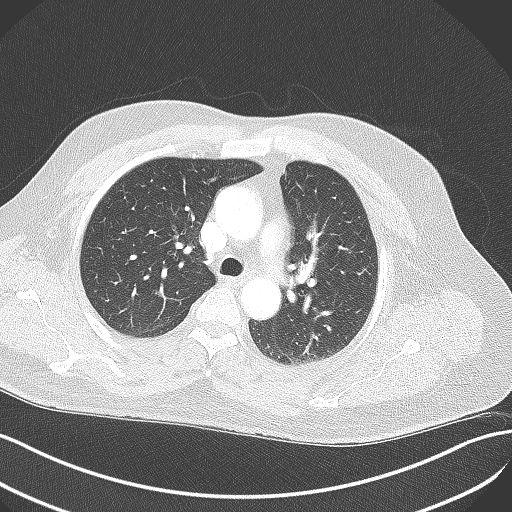
[im 121/157  mediastinal]
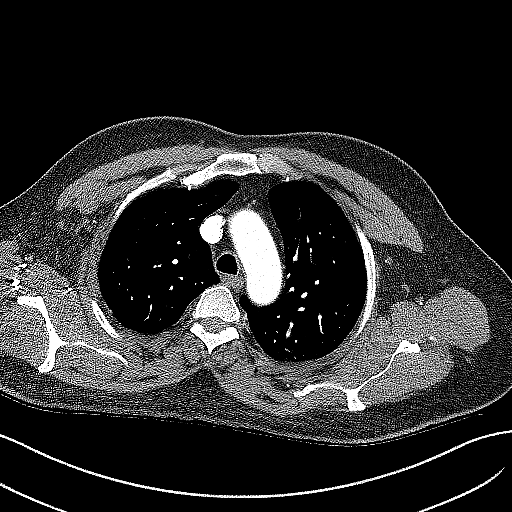
[im 133/157  lung]
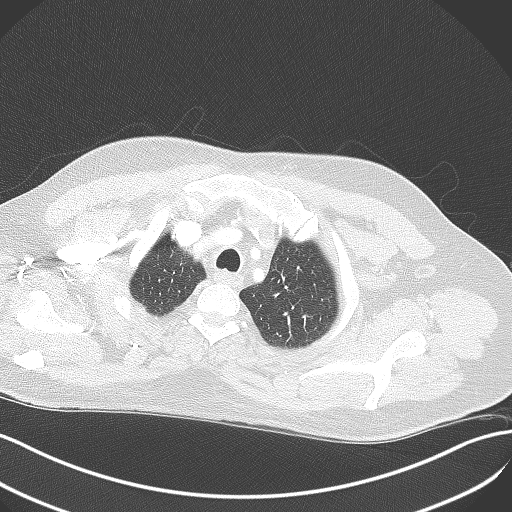
[im 145/157  mediastinal]
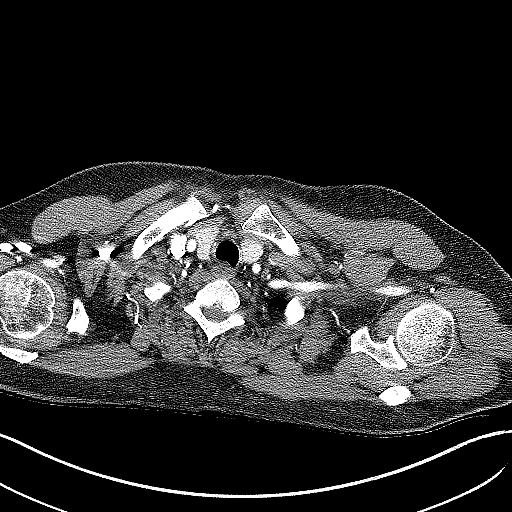

[Series 9: cor soft · coronal · 0.63mm/px · 1 of 158 slices shown]
[im 79/158  mediastinal]
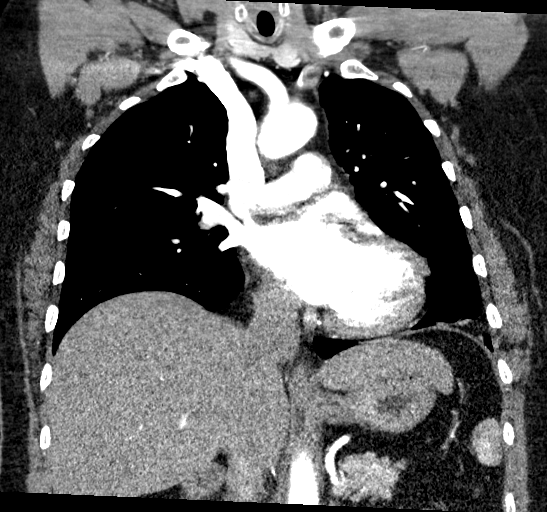

[13 of 36 positions shown; findings below may reference images not displayed]

FINDINGS: Cardiovascular: Intact aorta without significant atherosclerotic
change. Negative for hyperdense intramural hematoma, significant
aneurysm, or dissection. Ascending aorta maximal diameter 3.6 cm.
Patent 3 vessel arch anatomy.

No mediastinal hemorrhage or hematoma. Normal heart size. Aortic
valve calcifications noted.

Central pulmonary arteries are patent. No significant filling defect
or acute pulmonary embolus by CTA.

No pericardial effusion.

Pulmonary veins appear patent. No anomalous pulmonary venous return.
Central venous structures are also patent without Jim
process.

Mediastinum/Nodes: Small calcified subcarinal and left infrahilar
lymph nodes from granulomatous disease. No bulky adenopathy.
Thyroid, trachea and esophagus unremarkable. Esophagus nondilated.
No hiatal hernia.

Lungs/Pleura: Minor dependent basilar atelectasis. Stable calcified
left lower lobe granuloma posteriorly measuring 10 mm.

Trachea and central airways are patent. No acute airspace process,
collapse or consolidation. No interstitial process or edema.

No pleural abnormality, effusion or pneumothorax.

Upper Abdomen: No acute abnormality.

Musculoskeletal: symmetric gynecomastia. Lower cervical fusion
hardware partially imaged. Old left rib fractures noted. No acute
osseous finding. Intact thoracic spine and sternum.

Review of the MIP images confirms the above findings.
IMPRESSION: Negative for any significant thoracic aortic disease. No evidence of
aneurysm or dissection.

Negative for significant acute pulmonary embolus

Aortic valve calcifications noted

Evidence of remote granulomatous disease as above.

No other acute intrathoracic finding.

## 2024-03-06 IMAGING — CR DG CERVICAL SPINE 1V
1 series · 1 of 1 positions shown · non-contrast
Comparison: 08/27/2021

CLINICAL DATA: Status post cervical fusion

EXAM:
DG CERVICAL SPINE - 1 VIEW

[AP]
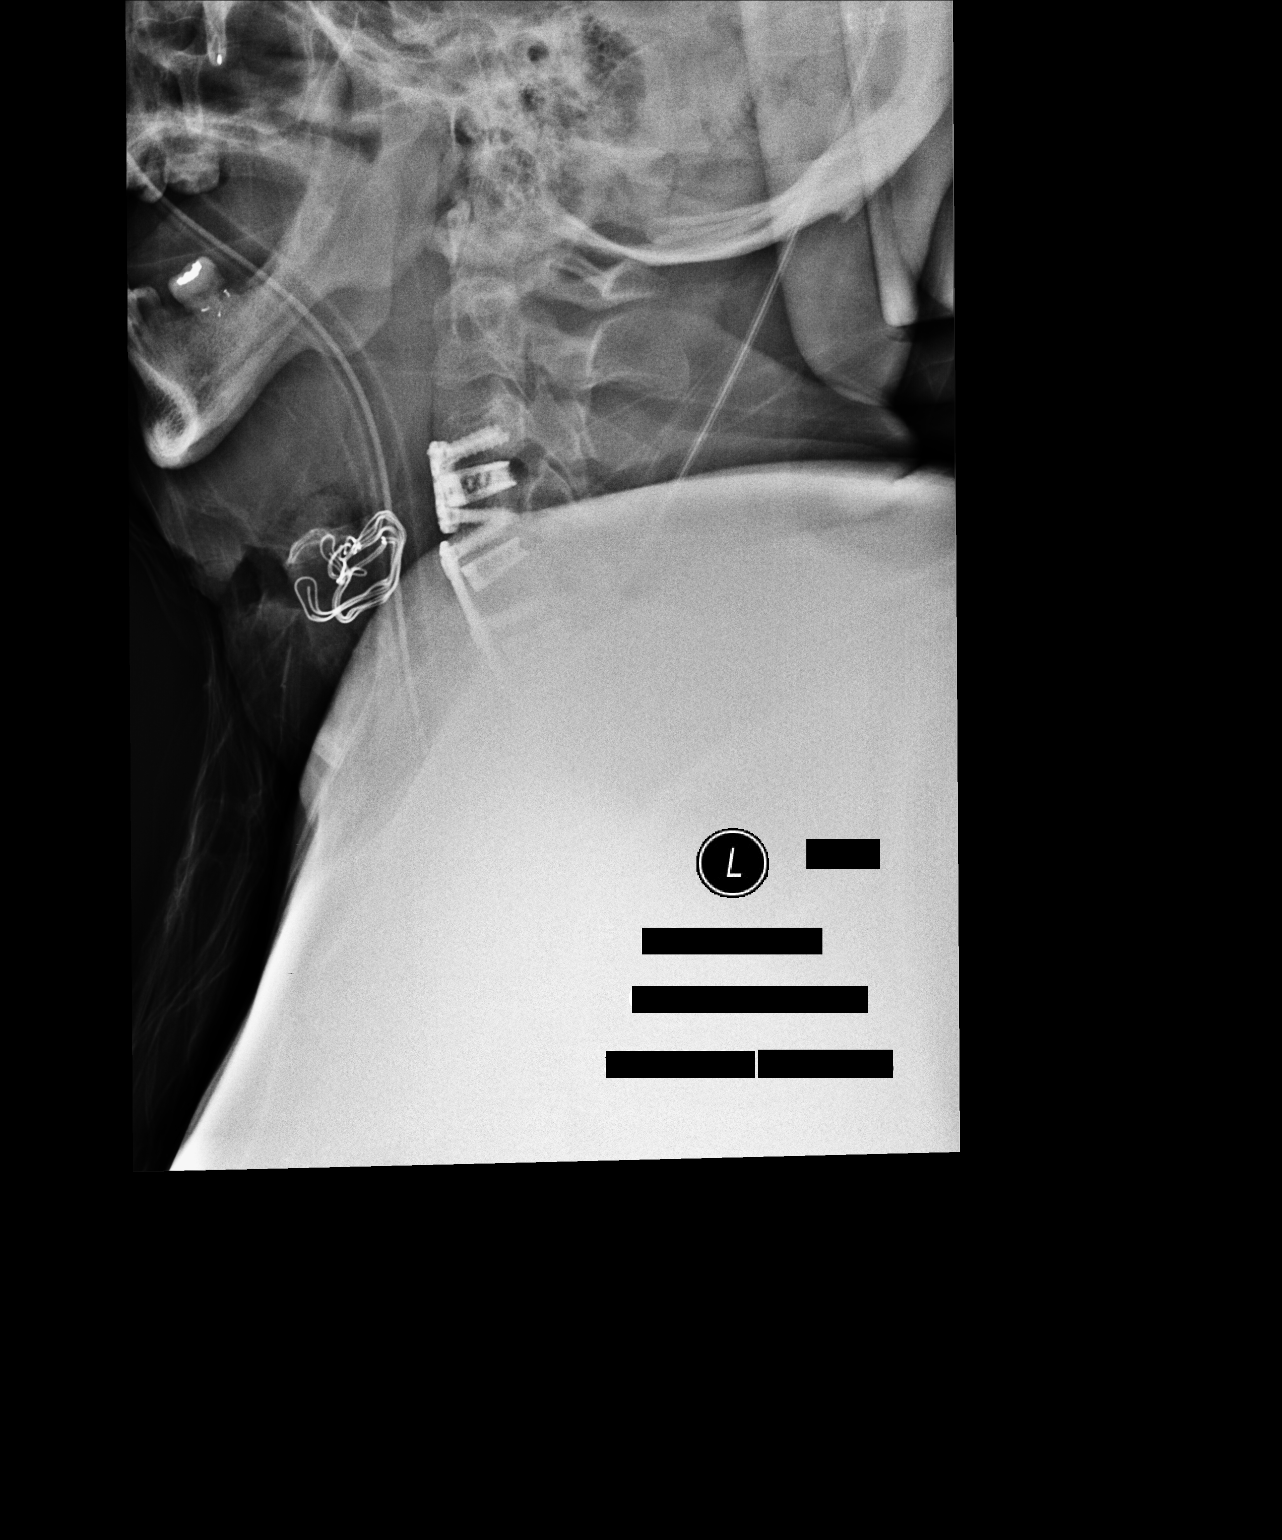

[1 of 1 positions shown; findings below may reference images not displayed]

FINDINGS: New interbody fusion at C3-4 is noted with anterior fixation. Stable
fusion at C4-5, C5-6 and C6-7 is again noted. No acute abnormality
noted.
IMPRESSION: New C3-4 fusion.
